# Patient Record
Sex: Male | Born: 1940 | Race: White | Hispanic: No | Marital: Married | Smoking: Never smoker
Health system: Southern US, Community
[De-identification: ages and names within clinical notes are randomized; demographics above are authoritative.]

## PROBLEM LIST (undated history)

## (undated) DIAGNOSIS — E7800 Pure hypercholesterolemia, unspecified: Secondary | ICD-10-CM

## (undated) HISTORY — PX: HAND SURGERY: SHX662

## (undated) HISTORY — PX: SHOULDER SURGERY: SHX246

---

## 2004-08-07 ENCOUNTER — Emergency Department (HOSPITAL_COMMUNITY): Admission: EM | Admit: 2004-08-07 | Discharge: 2004-08-07 | Payer: Self-pay | Admitting: Emergency Medicine

## 2012-11-16 ENCOUNTER — Encounter (HOSPITAL_BASED_OUTPATIENT_CLINIC_OR_DEPARTMENT_OTHER): Payer: Self-pay | Admitting: Emergency Medicine

## 2012-11-16 ENCOUNTER — Emergency Department (HOSPITAL_BASED_OUTPATIENT_CLINIC_OR_DEPARTMENT_OTHER)
Admission: EM | Admit: 2012-11-16 | Discharge: 2012-11-16 | Disposition: A | Discharge status: Home / Self Care | Payer: Worker's Compensation | Attending: Emergency Medicine | Admitting: Emergency Medicine

## 2012-11-16 ENCOUNTER — Emergency Department (HOSPITAL_BASED_OUTPATIENT_CLINIC_OR_DEPARTMENT_OTHER): Payer: Worker's Compensation

## 2012-11-16 DIAGNOSIS — Z79899 Other long term (current) drug therapy: Secondary | ICD-10-CM | POA: Insufficient documentation

## 2012-11-16 DIAGNOSIS — Y9289 Other specified places as the place of occurrence of the external cause: Secondary | ICD-10-CM | POA: Insufficient documentation

## 2012-11-16 DIAGNOSIS — X500XXA Overexertion from strenuous movement or load, initial encounter: Secondary | ICD-10-CM | POA: Insufficient documentation

## 2012-11-16 DIAGNOSIS — IMO0002 Reserved for concepts with insufficient information to code with codable children: Secondary | ICD-10-CM | POA: Insufficient documentation

## 2012-11-16 DIAGNOSIS — Y9389 Activity, other specified: Secondary | ICD-10-CM | POA: Insufficient documentation

## 2012-11-16 DIAGNOSIS — S8390XA Sprain of unspecified site of unspecified knee, initial encounter: Secondary | ICD-10-CM

## 2012-11-16 DIAGNOSIS — E7800 Pure hypercholesterolemia, unspecified: Secondary | ICD-10-CM | POA: Insufficient documentation

## 2012-11-16 HISTORY — DX: Pure hypercholesterolemia, unspecified: E78.00

## 2012-11-16 NOTE — ED Provider Notes (Signed)
History     CSN: [id number]  Arrival date & time 11/16/12  1036   First MD Initiated Contact with Patient 11/16/12 1137      Chief Complaint  Patient presents with  . Knee Injury    (Consider location/radiation/quality/duration/timing/severity/associated sxs/prior treatment) HPI Patient complaining of sudden onset of left knee pain began while he was ambulating at work just prior to admission. As he stepped on  Broken up asphalt, but  Past Medical History  Diagnosis Date  . Hypercholesteremia     No past surgical history on file.  No family history on file.  History  Substance Use Topics  . Smoking status: Not on file  . Smokeless tobacco: Not on file  . Alcohol Use:       Review of Systems  Allergies  Review of patient's allergies indicates no known allergies.  Home Medications   Current Outpatient Rx  Name  Route  Sig  Dispense  Refill  . SIMVASTATIN 40 MG PO TABS   Oral   Take 40 mg by mouth every evening.           BP 171/83  Pulse 79  Temp 98.1 F (36.7 C) (Oral)  Resp 18  SpO2 98%  Physical Exam  Nursing note and vitals reviewed. Constitutional: He is oriented to person, place, and time. He appears well-developed and well-nourished.  HENT:  Head: Normocephalic and atraumatic.  Eyes: Conjunctivae normal are normal. Pupils are equal, round, and reactive to light.  Neck: Normal range of motion. Neck supple.  Cardiovascular: Normal rate.   Pulmonary/Chest: Effort normal.  Abdominal: Soft. Bowel sounds are normal.  Musculoskeletal: Normal range of motion. He exhibits tenderness. He exhibits no edema.       Tender medial lower knee, full arom, no ligament laxity noted.  No effusion noted on exam, dp intact, neurovascularly intact distal to injury.   Neurological: He is alert and oriented to person, place, and time. He has normal reflexes.  Skin: Skin is warm and dry.    ED Course  Procedures (including critical care time)  Labs Reviewed -  No data to display Dg Knee Complete 4 Views Left  11/16/2012  *RADIOLOGY REPORT*  Clinical Data: Twisted knee  LEFT KNEE - COMPLETE 4+ VIEW  Comparison: None.  Findings: Four views of the left knee submitted.  No acute fracture or subluxation.  Spurring of patella is noted.  Small joint effusion.  IMPRESSION: No acute fracture or subluxation.  Spurring of patella.  Small joint effusion.   Original Report Authenticated By: Natasha Mead, M.D.      No diagnosis found.    MDM  Test results x-Shagun Wordell with patient. Plan immobilizer and crutches, patient thinks he'll do better with and I agree. However, we do not have a walker to give him from the emergency department. He'll be discharged on crutches and given a prescription for walker. He is referred to Dr. Pearletha Forge for followup.        Hilario Quarry, MD 11/16/12 [phone]

## 2012-11-16 NOTE — ED Notes (Signed)
Pt was sent home with crutches (temporary) and script for walker.  Pt given phone number for [facility] and several other pharmacies to get walker from.  Workers comp form stapled to d/c paperwork and explained to pt that dr Lois Huxley should fill this out after seeing him, advised him to call and mk apt with this MD tomorrow.

## 2012-11-16 NOTE — ED Notes (Signed)
Pt injured left knee at work.  Pt twisted knee and has pain to anterior lateral side of knee.  Able to move toes, good pulses.  No deformities.  No other injuries.

## 2012-11-18 ENCOUNTER — Encounter: Payer: Self-pay | Admitting: Family Medicine

## 2012-11-18 ENCOUNTER — Ambulatory Visit (INDEPENDENT_AMBULATORY_CARE_PROVIDER_SITE_OTHER): Payer: Worker's Compensation | Admitting: Family Medicine

## 2012-11-18 VITALS — BP 166/82 | HR 78 | Ht 71.0 in | Wt 210.0 lb

## 2012-11-18 DIAGNOSIS — S8990XA Unspecified injury of unspecified lower leg, initial encounter: Secondary | ICD-10-CM

## 2012-11-18 DIAGNOSIS — S8992XA Unspecified injury of left lower leg, initial encounter: Secondary | ICD-10-CM

## 2012-11-18 MED ORDER — MELOXICAM 15 MG PO TABS: 15.0000 mg | ORAL_TABLET | Freq: Every day | ORAL | Status: AC

## 2012-11-18 NOTE — Patient Instructions (Addendum)
You have a medial meniscal tear of your left knee. A lot of people have these and if we can calm down the inflammation and swelling, they can live with these without any problems. Take meloxicam (mobic) 15 mg daily with food x 7 days then as needed. Ice 15 minutes at a time 3-4 times a day. ACE wrap for compression and support. Straight leg raises 3 sets of 10 daily as discussed. Call me if you need something stronger for pain. Out of work until I see you back. Follow up with me in 2 1/2 weeks.

## 2012-11-20 ENCOUNTER — Encounter: Payer: Self-pay | Admitting: Family Medicine

## 2012-11-20 DIAGNOSIS — S8992XA Unspecified injury of left lower leg, initial encounter: Secondary | ICD-10-CM | POA: Insufficient documentation

## 2012-11-20 NOTE — Assessment & Plan Note (Signed)
radiographs normal and with excellent preservation of joint spaces.  Consistent with a medial meniscal tear. Start with conservative treatment first - meloxicam, icing, ace wrap, home exercise program.  Out of work for now.  F/u in about 3 weeks for reevaluation.  Consider MRI, injection if not improving.

## 2012-11-20 NOTE — Progress Notes (Signed)
  Subjective:    Patient ID: Martin Contreras, male    DOB: 03-10-1941, 71 y.o.   MRN: [id number]  PCP: Dr. Alverda Skeans  HPI 71 yo M here for left knee injury.  Patient reports on 12/8 in the morning he was walking to his truck while at work (works for [employer]) when he believes he stepped into a hole and either twisted or hyperextended his left knee. Injury happened quickly with severe pain throughout knee, inability to bear weight. Has had popping since then. Not taking any medications or icing currently. X-rays in ED negative for acute injury but showed moderate DJD. Of note 1 1/2 years ago he had some pain in this knee that improved with cortisone shot. No prior injuries.  Past Medical History  Diagnosis Date  . Hypercholesteremia     Current Outpatient Prescriptions on File Prior to Visit  Medication Sig Dispense Refill  . simvastatin (ZOCOR) 40 MG tablet Take 40 mg by mouth every evening.        Past Surgical History  Procedure Date  . Hand surgery   . Shoulder surgery     rotator cuff    No Known Allergies  History   Social History  . Marital Status: Married    Spouse Name: N/A    Number of Children: N/A  . Years of Education: N/A   Occupational History  . Not on file.   Social History Main Topics  . Smoking status: Never Smoker   . Smokeless tobacco: Not on file  . Alcohol Use: Not on file  . Drug Use: Not on file  . Sexually Active: Not on file   Other Topics Concern  . Not on file   Social History Narrative  . No narrative on file    Family History  Problem Relation Age of Onset  . Sudden death Neg Hx   . Hyperlipidemia Neg Hx   . Hypertension Neg Hx   . Heart attack Neg Hx   . Diabetes Neg Hx     BP 166/82  Pulse 78  Ht 5\' 11"  (1.803 m)  Wt 210 lb (95.255 kg)  BMI 29.29 kg/m2  Review of Systems See HPI above.    Objective:   Physical Exam Gen: NAD  L knee: No gross deformity, ecchymoses, effusion.   TTP medial joint line.   No post patellar facet, lateral joint line, other TTP. ROM 0 - 120 degrees. Negative ant/post drawers. Negative valgus/varus testing. Negative lachmanns. Positive mcmurrays medially.  Positive apleys Negative patellar apprehension, clarkes. NV intact distally    Assessment & Plan:  1. Left knee injury - radiographs normal and with excellent preservation of joint spaces.  Consistent with a medial meniscal tear. Start with conservative treatment first - meloxicam, icing, ace wrap, home exercise program.  Out of work for now.  F/u in about 3 weeks for reevaluation.  Consider MRI, injection if not improving.

## 2012-12-08 ENCOUNTER — Encounter: Payer: Self-pay | Admitting: Family Medicine

## 2012-12-08 ENCOUNTER — Ambulatory Visit (INDEPENDENT_AMBULATORY_CARE_PROVIDER_SITE_OTHER): Payer: Medicare Other | Admitting: Family Medicine

## 2012-12-08 VITALS — BP 152/84 | HR 77 | Ht 71.0 in | Wt 210.0 lb

## 2012-12-08 DIAGNOSIS — S8992XA Unspecified injury of left lower leg, initial encounter: Secondary | ICD-10-CM

## 2012-12-08 DIAGNOSIS — S99929A Unspecified injury of unspecified foot, initial encounter: Secondary | ICD-10-CM

## 2012-12-08 NOTE — Patient Instructions (Addendum)
Follow up with me in 3-4 weeks for reevaluation. Return to work at this time. If you want to go ahead with an injection sooner we can but I don't think this is necessary given how much improvement you've had to date.

## 2012-12-10 ENCOUNTER — Encounter: Payer: Self-pay | Admitting: Family Medicine

## 2012-12-10 NOTE — Assessment & Plan Note (Signed)
radiographs normal and with excellent preservation of joint spaces.  Consistent with a medial meniscal tear. Good improvement since last visit 3 weeks ago.  He inquired about an injection but given his improvement I advised we wait on this.  Return to work tomorrow but no deep squats, crawling.  F/u in 3-4 weeks.

## 2012-12-10 NOTE — Progress Notes (Signed)
  Subjective:    Patient ID: Martin Contreras, male    DOB: 1941-02-02, 72 y.o.   MRN: [id number]  PCP: Dr. Alverda Skeans  HPI  72 yo M here for f/u left knee injury.  12/10: Patient reports on 12/8 in the morning he was walking to his truck while at work (works for [employer]) when he believes he stepped into a hole and either twisted or hyperextended his left knee. Injury happened quickly with severe pain throughout knee, inability to bear weight. Has had popping since then. Not taking any medications or icing currently. X-rays in ED negative for acute injury but showed moderate DJD. Of note 1 1/2 years ago he had some pain in this knee that improved with cortisone shot. No prior injuries.  12/30: Patient reports he feels much better than last visit. Able to bear weight more comfortably. Still gets popping but this is not painful. Takes meloxicam. Not icing or needing to wrap knee now. Some soreness inside part of knee still. Has been out of work.  Past Medical History  Diagnosis Date  . Hypercholesteremia     Current Outpatient Prescriptions on File Prior to Visit  Medication Sig Dispense Refill  . meloxicam (MOBIC) 15 MG tablet Take 1 tablet (15 mg total) by mouth daily. With food.  30 tablet  1  . simvastatin (ZOCOR) 40 MG tablet Take 40 mg by mouth every evening.        Past Surgical History  Procedure Date  . Hand surgery   . Shoulder surgery     rotator cuff    No Known Allergies  History   Social History  . Marital Status: Married    Spouse Name: N/A    Number of Children: N/A  . Years of Education: N/A   Occupational History  . Not on file.   Social History Main Topics  . Smoking status: Never Smoker   . Smokeless tobacco: Not on file  . Alcohol Use: Not on file  . Drug Use: Not on file  . Sexually Active: Not on file   Other Topics Concern  . Not on file   Social History Narrative  . No narrative on file    Family History  Problem Relation  Age of Onset  . Sudden death Neg Hx   . Hyperlipidemia Neg Hx   . Hypertension Neg Hx   . Heart attack Neg Hx   . Diabetes Neg Hx     BP 152/84  Pulse 77  Ht 5\' 11"  (1.803 m)  Wt 210 lb (95.255 kg)  BMI 29.29 kg/m2  Review of Systems  See HPI above.    Objective:   Physical Exam  Gen: NAD  L knee: No gross deformity, ecchymoses, effusion.   Mild TTP medial joint line.  No post patellar facet, lateral joint line, other TTP. FROM Negative ant/post drawers. Negative valgus/varus testing. Negative lachmanns. Negative mcmurrays medially.  Mild pain with apleys Negative patellar apprehension, clarkes. NV intact distally    Assessment & Plan:  1. Left knee injury - radiographs normal and with excellent preservation of joint spaces.  Consistent with a medial meniscal tear. Good improvement since last visit 3 weeks ago.  He inquired about an injection but given his improvement I advised we wait on this.  Return to work tomorrow but no deep squats, crawling.  F/u in 3-4 weeks.

## 2013-01-05 ENCOUNTER — Ambulatory Visit: Payer: Self-pay | Admitting: Family Medicine

## 2013-01-06 ENCOUNTER — Ambulatory Visit: Payer: Self-pay | Admitting: Family Medicine

## 2013-09-06 IMAGING — CR DG KNEE COMPLETE 4+V*L*
4 series · 4 of 4 positions shown · non-contrast
Comparison: None.

CLINICAL DATA: Twisted knee

LEFT KNEE - COMPLETE 4+ VIEW

[t knee ap left]
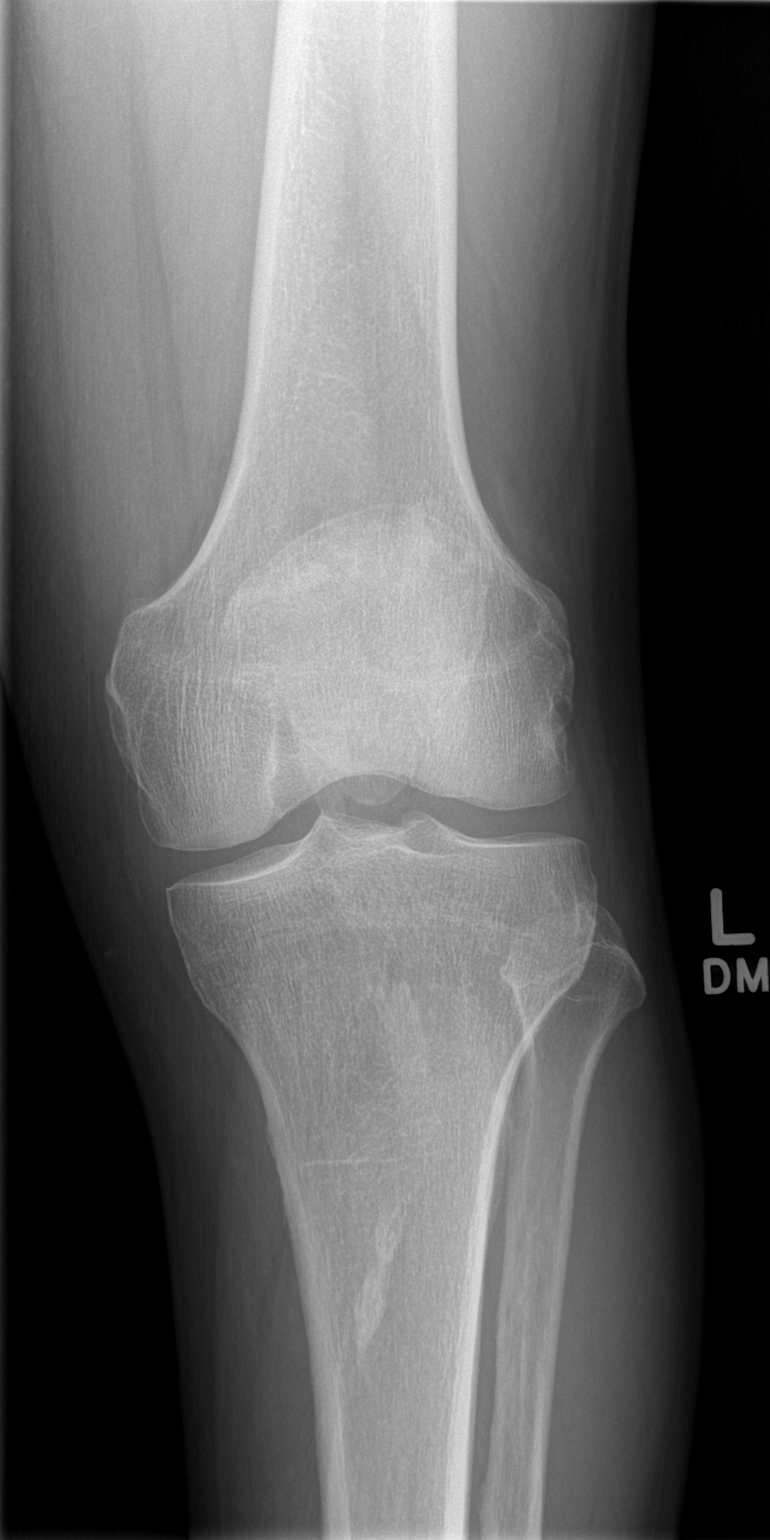

[t knee oblique left (1 of 2)]
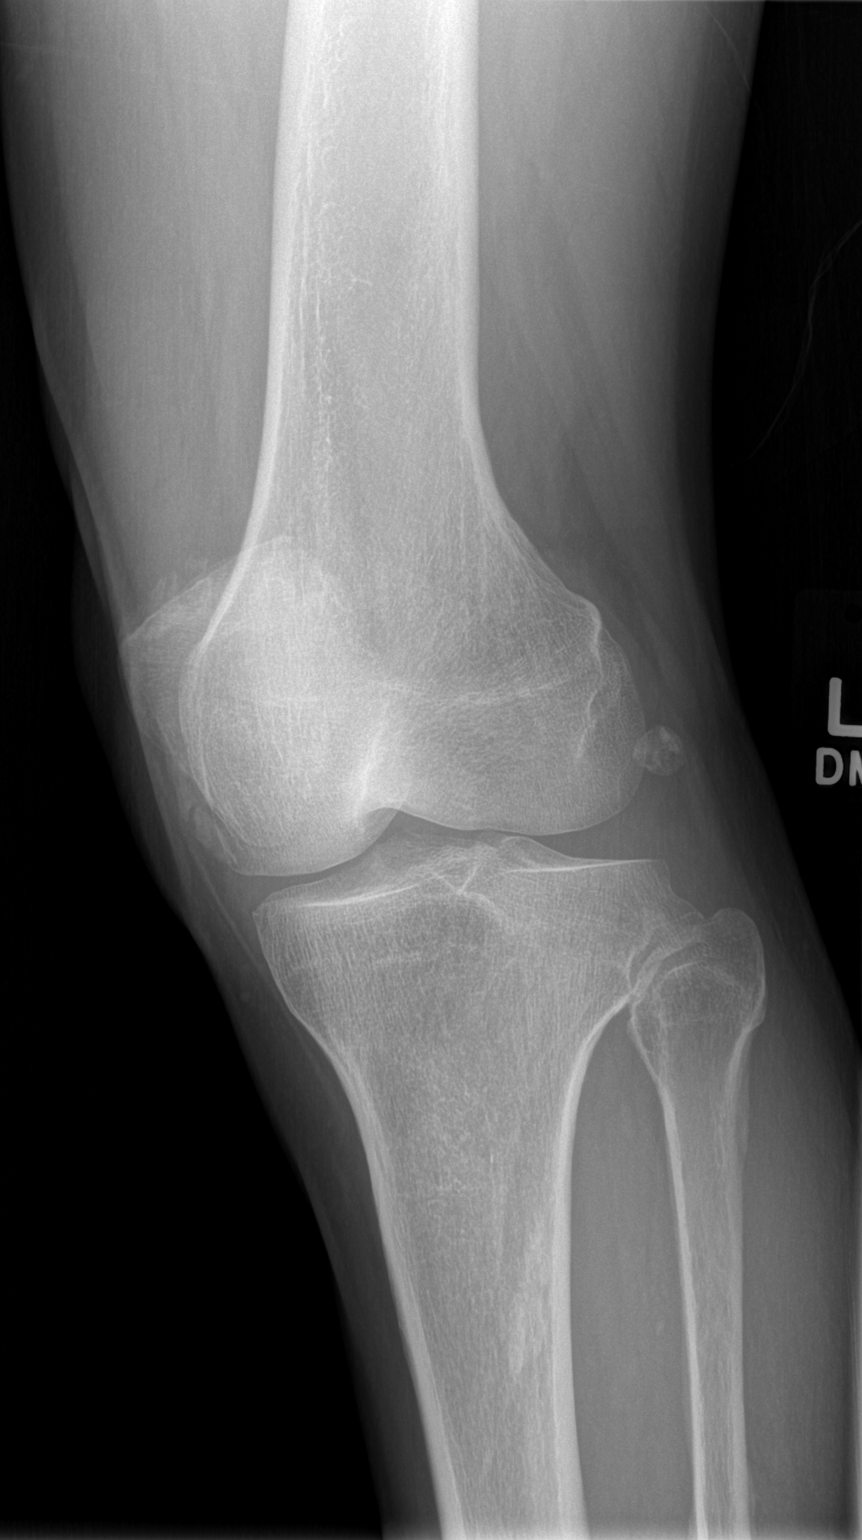

[t knee oblique left (2 of 2)]
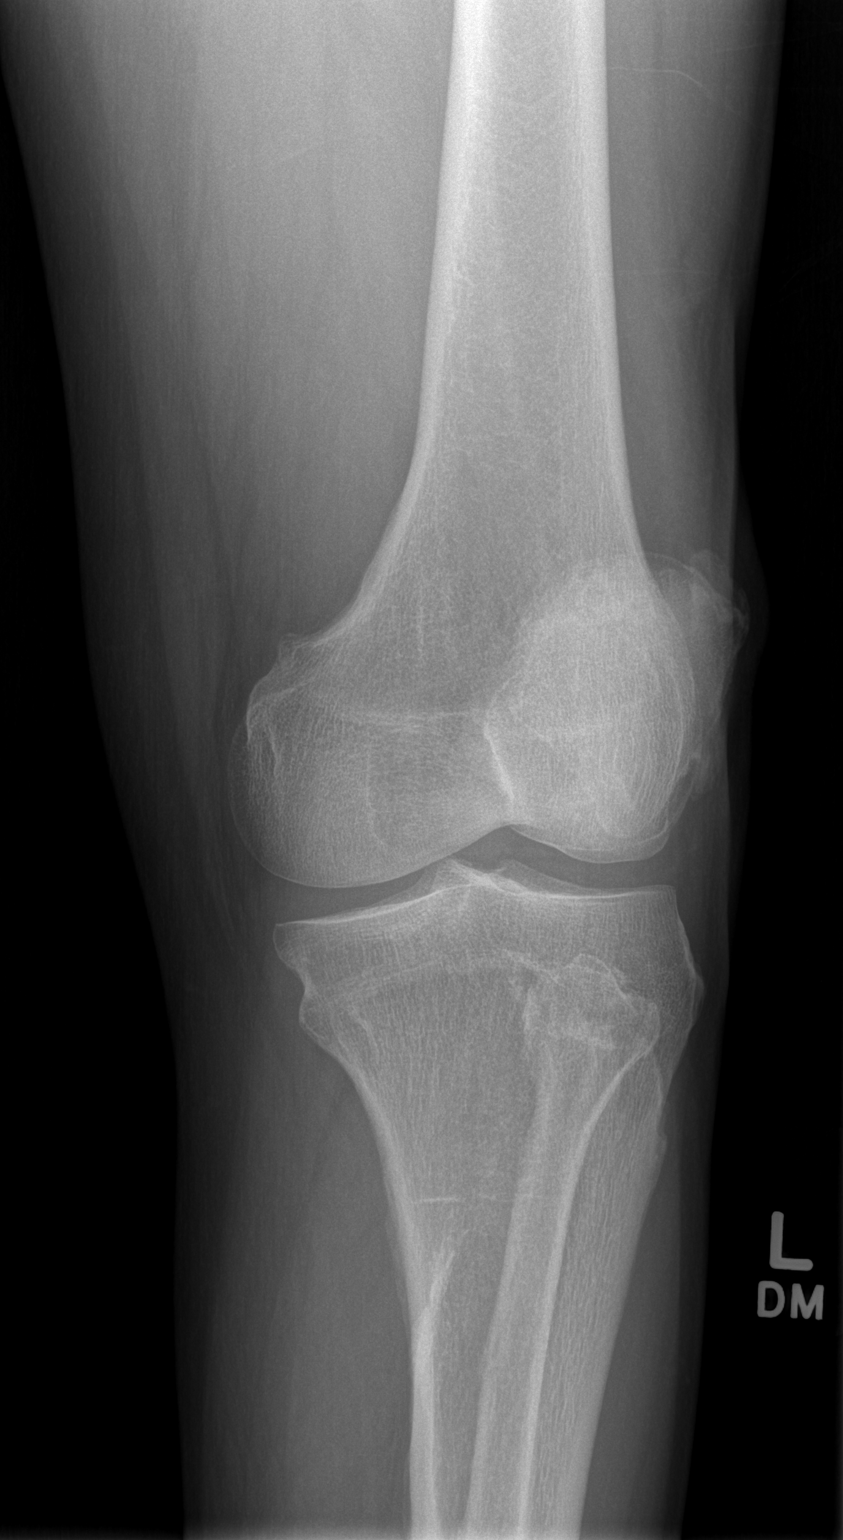

[t knee lat left]
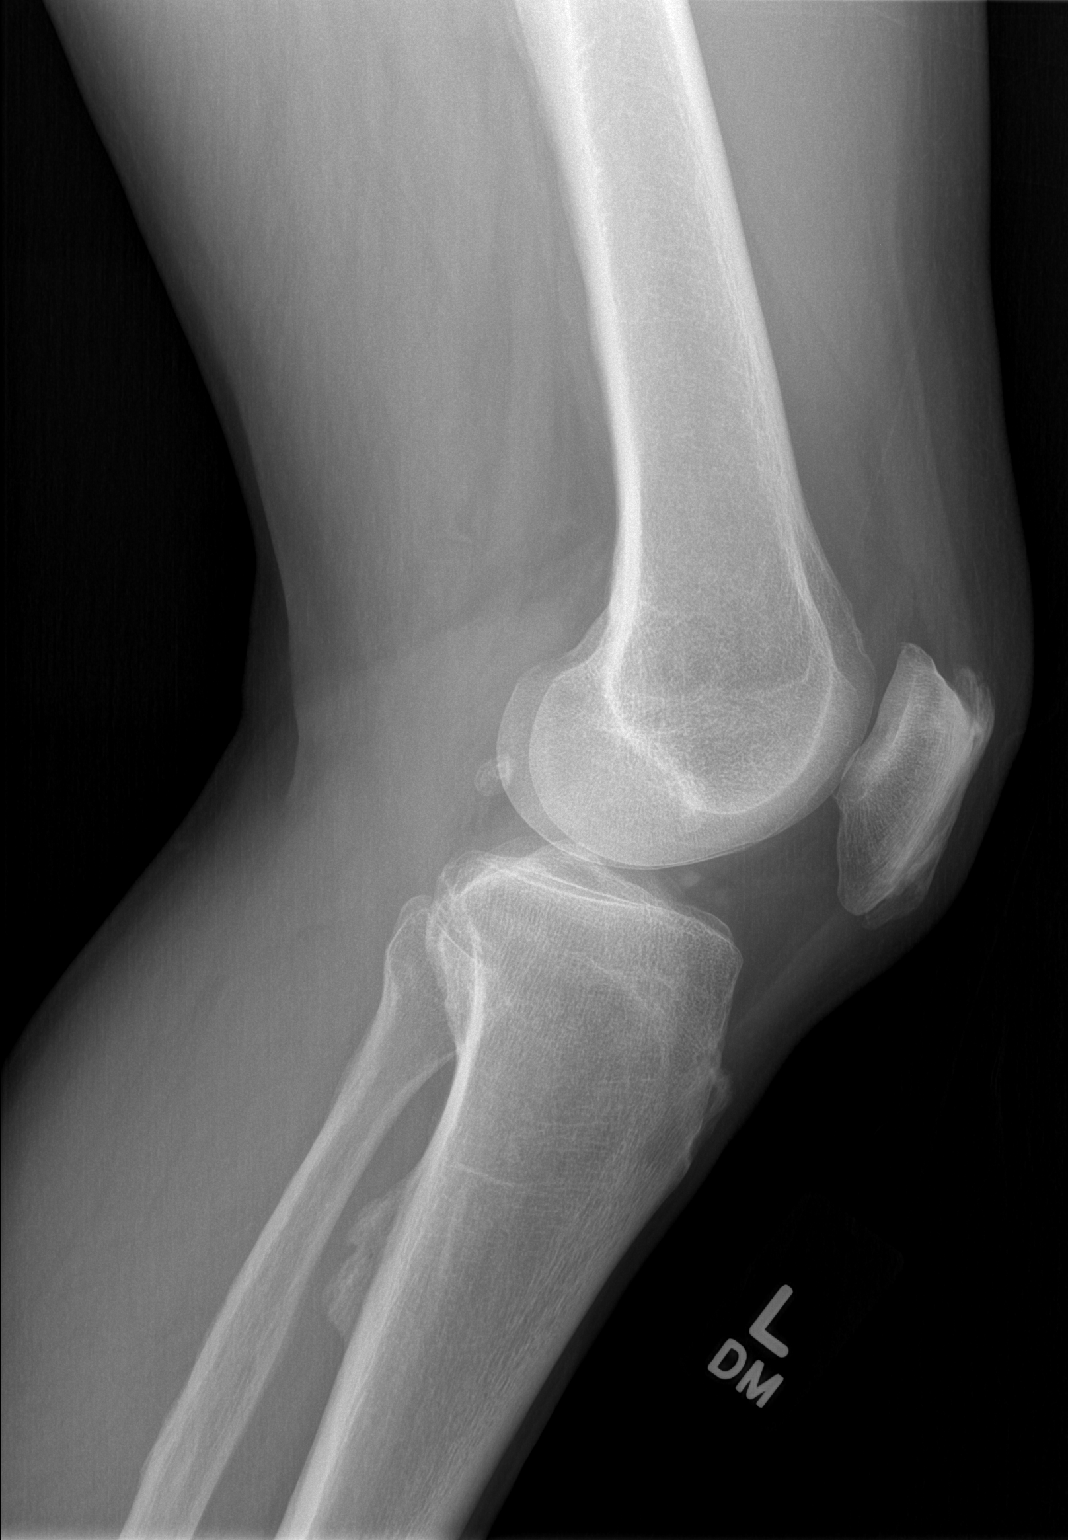

[4 of 4 positions shown; findings below may reference images not displayed]

FINDINGS: Four views of the left knee submitted.  No acute fracture
or subluxation.  Spurring of patella is noted.  Small joint
effusion.
IMPRESSION: No acute fracture or subluxation.  Spurring of patella.  Small
joint effusion.

## 2018-02-13 ENCOUNTER — Ambulatory Visit (INDEPENDENT_AMBULATORY_CARE_PROVIDER_SITE_OTHER): Payer: Medicare HMO | Admitting: Orthopaedic Surgery

## 2018-02-13 ENCOUNTER — Ambulatory Visit (INDEPENDENT_AMBULATORY_CARE_PROVIDER_SITE_OTHER): Payer: Self-pay

## 2018-02-13 ENCOUNTER — Encounter (INDEPENDENT_AMBULATORY_CARE_PROVIDER_SITE_OTHER): Payer: Self-pay | Admitting: Orthopaedic Surgery

## 2018-02-13 VITALS — BP 148/62 | HR 78 | Resp 16 | Ht 71.0 in | Wt 208.0 lb

## 2018-02-13 DIAGNOSIS — G8929 Other chronic pain: Secondary | ICD-10-CM | POA: Diagnosis not present

## 2018-02-13 DIAGNOSIS — M542 Cervicalgia: Secondary | ICD-10-CM | POA: Diagnosis not present

## 2018-02-13 DIAGNOSIS — M25511 Pain in right shoulder: Secondary | ICD-10-CM | POA: Diagnosis not present

## 2018-02-13 NOTE — Progress Notes (Signed)
Office Visit Note   Patient: Martin Contreras           Date of Birth: March 13, 1941           MRN: [id number] Visit Date: 02/13/2018              Requested by: No referring provider defined for this encounter. PCP: Ananias PilgrimAsres, Alehegn, MD   Assessment & Plan: Visit Diagnoses:  1. Cervicalgia   2. Chronic right shoulder pain     Plan: Neck and right upper extremity pain seems to originate from the cervical spine. There is considerable degenerative disc disease on plain film. Normal neurologic evaluation. To try a course of physical therapy and try over-the-counter medicines. If no improvement in the next month would like him to return to consider an MRI scan. Total time spent proximally 45 minutes in counseling and discussing films and treatment  Follow-Up Instructions: Return in about 1 month (around 03/16/2018), or if no improvement.   Orders:  Orders Placed This Encounter  Procedures  . XR Cervical Spine 2 or 3 views  . XR Shoulder Right  . Ambulatory referral to Physical Therapy   No orders of the defined types were placed in this encounter.     Procedures: No procedures performed   Clinical Data: No additional findings.   Subjective: Chief Complaint  Patient presents with  . 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 ALSO HAVING PAIN IN BASE OF NECK.  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 He oftentimes will [place] to [place] over period of about 4 hours. He notes that as time progresses neck will get relatively stiff he'll have some discomfort along the right side of his neck right shoulder and even occasionally some tingling into his right upper extremity. He's not had any injury or trauma. He is over 10 years status post rotator cuff tear repair of his right shoulder. Is not sure that the shoulder is actually the problem. No headaches. No trouble with his left upper extremity. He actually has noticed limited motion  of his neck  HPI  Review of Systems  Constitutional: Negative for fatigue and fever.  HENT: Negative for ear pain and tinnitus.   Eyes: Negative for pain.  Respiratory: Negative for cough and shortness of breath.   Cardiovascular: Negative for chest pain, palpitations and leg swelling.  Gastrointestinal: Negative for blood in stool, constipation and diarrhea.  Genitourinary: Negative for dysuria.  Musculoskeletal: Positive for neck pain and neck stiffness. Negative for back pain.  Skin: Negative for rash and wound.  Allergic/Immunologic: Negative for food allergies.  Neurological: Positive for weakness and numbness. Negative for dizziness, light-headedness and headaches.  Hematological: Does not bruise/bleed easily.  Psychiatric/Behavioral: Negative for sleep disturbance.     Objective: Vital Signs: BP (!) 148/62 (BP Location: Left Arm, Patient Position: Sitting, Cuff Size: Normal)   Pulse 78   Resp 16   Ht 5\' 11"  (1.803 m)   Wt 208 lb (94.3 kg)   BMI 29.01 kg/m   Physical Exam  Ortho Exam awake alert and oriented 3 comfortable sitting. He has considerable loss of motion of the cervical spine. He lacked about 2 fingerbreadths touching her chin to her chest. Only about 30 with normal neck extension from neutral. Only about 45-50 of normal rotation of the right and to the left. There was some pain referred to the posterior cervical chain interscapular region but  no referred pain with any motion to her shoulder either upper extremity. He flexes symmetrical both upper extremity. No pain with range of motion of his right shoulder. No impingement. Negative empty can testing. Good strength. No localized areas of tenderness.  Specialty Comments:  No specialty comments available.  Imaging: Xr Cervical Spine 2 Or 3 Views  Result Date: 02/13/2018 Films of the cervical spine obtained in 2 projections. There is straightening of the normal cervical lordosis. There is considerable  degenerative change C4-5 and C5-6 and C6-7 at those levels there is calcification of the anterior longitudinal ligament. There is facet sclerosis at C2-3 C3-4. No listhesis or evidence of fracture  Xr Shoulder Right  Result Date: 02/13/2018 Films of the right shoulder reveal a previous repair of rotator cuff tear with a metallic anchor. I think the 20 humeral head and the acromium is is normal. There are some degenerative changes acromioclavicular joint but just might be ectopic calcification from an old distal clavicle resection. There is some spurring of the distal aspect of the glenoid. There is a normal space between the humeral head and acromion. The humeral head is centered about the glenoid. No evidence of fracture    PMFS History: Patient Active Problem List   Diagnosis Date Noted  . Left knee injury 11/20/2012   Past Medical History:  Diagnosis Date  . Hypercholesteremia     Family History  Problem Relation Age of Onset  . Sudden death Neg Hx   . Hyperlipidemia Neg Hx   . Hypertension Neg Hx   . Heart attack Neg Hx   . Diabetes Neg Hx     Past Surgical History:  Procedure Laterality Date  . HAND SURGERY    . SHOULDER SURGERY     rotator cuff   Social History   Occupational History  . Not on file  Tobacco Use  . Smoking status: Never Smoker  Substance and Sexual Activity  . Alcohol use: Not on file  . Drug use: Not on file  . Sexual activity: Not on file

## 2018-02-17 ENCOUNTER — Ambulatory Visit (INDEPENDENT_AMBULATORY_CARE_PROVIDER_SITE_OTHER): Payer: Self-pay | Admitting: Orthopaedic Surgery

## 2018-03-05 ENCOUNTER — Ambulatory Visit: Payer: Medicare HMO | Admitting: Rehabilitative and Restorative Service Providers"

## 2018-03-05 ENCOUNTER — Encounter: Payer: Self-pay | Admitting: Rehabilitative and Restorative Service Providers"

## 2018-03-05 DIAGNOSIS — G8929 Other chronic pain: Secondary | ICD-10-CM

## 2018-03-05 DIAGNOSIS — M542 Cervicalgia: Secondary | ICD-10-CM | POA: Diagnosis not present

## 2018-03-05 DIAGNOSIS — R293 Abnormal posture: Secondary | ICD-10-CM

## 2018-03-05 DIAGNOSIS — R29898 Other symptoms and signs involving the musculoskeletal system: Secondary | ICD-10-CM | POA: Diagnosis not present

## 2018-03-05 DIAGNOSIS — M25511 Pain in right shoulder: Secondary | ICD-10-CM | POA: Diagnosis not present

## 2018-03-05 NOTE — Patient Instructions (Signed)
Axial Extension (Chin Tuck)    Pull chin in and lengthen back of neck. Hold __5__ seconds while counting out loud. Repeat __10__ times. Do __several__ sessions per day.  Shoulder Blade Squeeze   Swim noodle along spine  Rotate shoulders back, then squeeze shoulder blades down and back. Hold 10 sec Repeat __10__ times. Do __several __ sessions per day.  Upper Back Strength: Lower Trapezius / Rotator Cuff " L's "     Arms in waitress pose, palms up. Press hands back and slide shoulder blades down. Hold for __5__ seconds. Repeat _10___ times. 1-2 times per day.    Scapular Retraction: Elbow Flexion (Standing)  "W's"     With elbows bent to 90, pinch shoulder blades together and rotate arms out, keeping elbows bent. Repeat __10__ times per set. Do __1-2__ sets per session. Do _several ___ sessions per day.  Scapula Adduction With Pectoralis Stretch: Low - Standing   Shoulders at 45 hands even with shoulders, keeping weight through legs, shift weight forward until you feel pull or stretch through the front of your chest. Hold _30__ seconds. Do _3__ times, _2-4__ times per day.   Scapula Adduction With Pectoralis Stretch: Mid-Range - Standing   Shoulders at 90 elbows even with shoulders, keeping weight through legs, shift weight forward until you feel pull or strength through the front of your chest. Hold __30_ seconds. Do _3__ times, __2-4_ times per day.   Scapula Adduction With Pectoralis Stretch: High - Standing   Shoulders at 120 hands up high on the doorway, keeping weight on feet, shift weight forward until you feel pull or stretch through the front of your chest. Hold _30__ seconds. Do _3__ times, _2-3__ times per day.  TENS UNIT: This is helpful for muscle pain and spasm.   Search and Purchase a TENS 7000 2nd edition at www.tenspros.com. It should be less than $30.     TENS unit instructions: Do not shower or bathe with the unit on Turn the unit off  before removing electrodes or batteries If the electrodes lose stickiness add a drop of water to the electrodes after they are disconnected from the unit and place on plastic sheet. If you continued to have difficulty, call the TENS unit company to purchase more electrodes. Do not apply lotion on the skin area prior to use. Make sure the skin is clean and dry as this will help prolong the life of the electrodes. After use, always check skin for unusual red areas, rash or other skin difficulties. If there are any skin problems, does not apply electrodes to the same area. Never remove the electrodes from the unit by pulling the wires. Do not use the TENS unit or electrodes other than as directed. Do not change electrode placement without consultating your therapist or physician. Keep 2 fingers with between each electrode.    [facility] [address] [place], [place] [address]  [phone] (office) [phone] (fax)

## 2018-03-05 NOTE — Therapy (Signed)
[facility] [place] [address] [place], [place], [address] Phone: [phone]   Fax:  [phone]  Physical Therapy Evaluation  Patient Details  Name: Martin Contreras MRN: [id number] Date of Birth: 1941-02-04 Referring Provider: Dr Norlene Campbell    Encounter Date: 03/05/2018  PT End of Session - 03/05/18 1102    Visit Number  1    Number of Visits  12    Date for PT Re-Evaluation  04/16/18    PT Start Time  1100    PT Stop Time  1158    PT Time Calculation (min)  58 min    Activity Tolerance  Patient tolerated treatment well       Past Medical History:  Diagnosis Date  . Hypercholesteremia     Past Surgical History:  Procedure Laterality Date  . HAND SURGERY    . SHOULDER SURGERY     rotator cuff    There were no vitals filed for this visit.   Subjective Assessment - 03/05/18 1111    Subjective  Patient report that he have had some pain in the Rt neck area for several months - stiffness and some discomfort with movements/activities. he has limited movement in the cervical spine. Patient feels that he Rt shoulder is causing the neck pain. Patient had Rt RCR ~ 10-12 yrs ago.     Pertinent History  HTN; Rt RCR ~ 10-12 yrs ago; cervical pain     Currently in Pain?  Yes    Pain Score  3     Pain Location  Neck    Pain Orientation  Right    Pain Descriptors / Indicators  Tightness;Aching    Pain Radiating Towards  into the Rt side of the neck and upper trap area     Pain Onset  More than a month ago    Pain Frequency  Intermittent    Aggravating Factors   lying on Rt side to sleep; pulling something with Rt UE; lifting something heavy with Rt UE    Pain Relieving Factors  rest         [facility] PT Assessment - 03/05/18 0001      Assessment   Medical Diagnosis  Cervicalgia; chronic Rt shoulder pain     Referring Provider  Dr Norlene Campbell     Onset Date/Surgical Date  08/10/17    Hand Dominance  Right    Next  MD Visit  PRN     Prior Therapy  none for neck      Precautions   Precautions  None      Balance Screen   Has the patient fallen in the past 6 months  No    Has the patient had a decrease in activity level because of a fear of falling?   No    Is the patient reluctant to leave their home because of a fear of falling?   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     Leisure  yard work; household activites; walking ~ 1-2 days a week 30 min; golfing; working on cars       Observation/Other Assessments   Focus on Therapeutic Outcomes (FOTO)   40% limitation       Sensation   Additional Comments  WFLs per pt report  Posture/Postural Control   Posture Comments  head forward; shoulders rounded and elevated; incresaed thoracic kyphosis; scapulae abducted and rotated along the thoracic wall; head of he humerus anterior in orientation       AROM   Right Shoulder Extension  60 Degrees    Right Shoulder Flexion  120 Degrees    Right Shoulder ABduction  115 Degrees    Right Shoulder Internal Rotation  32 Degrees    Right Shoulder External Rotation  75 Degrees    Left Shoulder Extension  42 Degrees    Left Shoulder Flexion  120 Degrees    Left Shoulder ABduction  128 Degrees    Left Shoulder Internal Rotation  32 Degrees    Left Shoulder External Rotation  73 Degrees    Cervical Flexion  42    Cervical Extension  8    Cervical - Right Side Bend  17    Cervical - Left Side Bend  18    Cervical - Right Rotation  41    Cervical - Left Rotation  42      Strength   Overall Strength Comments  5/5 bilat UE's       Palpation   Spinal mobility  hypomobility cervical spine     Palpation comment  muscular tightness through the pecs; upper trap; leveator; ant/lat/posterior cervical musculature Rt > Lt               No data recorded  Objective measurements  completed on examination: See above findings.      [facility] Adult PT Treatment/Exercise - 03/05/18 0001      Therapeutic Activites    Therapeutic Activities  -- myofacial ball release work       Neuro Re-ed    Neuro Re-ed Details   postural correction working on chest lift engaging posterior shoulder girdle musculature       Shoulder Exercises: Standing   Other Standing Exercises  axial extension 10 sec x 10; scapu squeeze 10 sec x 10; L's x 10; W's x 10 with swim noodle       Shoulder Exercises: Stretch   Other Shoulder Stretches  3 way doorway stretch 30 sec x 2 each position       Moist Heat Therapy   Number Minutes Moist Heat  15 Minutes    Moist Heat Location  Cervical;Shoulder Rt      Electrical Stimulation   Electrical Stimulation Location  Rt posterior cervical and Rt shoulder girdle area     Electrical Stimulation Action  IFC    Electrical Stimulation Parameters  to tolerance    Electrical Stimulation Goals  Pain;Tone             PT Education - 03/05/18 1144    Education provided  Yes    Education Details  posture; HEP; ball release work; Museum/gallery curator) Educated  Patient;Spouse wife - Nettie Elm     Methods  Explanation;Demonstration;Tactile cues;Verbal cues;Handout    Comprehension  Verbalized understanding;Returned demonstration;Verbal cues required;Tactile cues required          PT Long Term Goals - 03/05/18 1203      PT LONG TERM GOAL #1   Title  Decrease pain in Rt cervical and shoulder girdle area by 50-75% allowing patient to participate in normal functional activities without limitations 04/16/18    Time  6    Period  Weeks    Status  New      PT LONG TERM  GOAL #2   Title  Improve posture and alignment with patient to demonstrate improved upright posture with posterior shoulder girdle musculature engaged 04/16/18    Time  6    Period  Weeks    Status  New      PT LONG TERM GOAL #3   Title  Increase cervical ext/lateral flexion/rotation by  5-8 degrees and bilat shoulder elevation by 5-10 deg 04/16/18    Time  6    Period  Weeks    Status  New      PT LONG TERM GOAL #4   Title  Independent in HEP 04/16/18    Time  6    Period  Weeks    Status  New      PT LONG TERM GOAL #5   Title  Improve FOTO to </= 33% limitation 04/16/18    Time  6    Period  Weeks    Status  New             Plan - 03/05/18 1200    Clinical Impression Statement  Martin Contreras presents with cervical and shoulder girdle dysfunction including poor posture and alignment; limited cervical/thoracic/shoulder ROM; muscular tightness through cervical and shoulder musculature; tightness and discomfort limiting functional activities. Patient will benefit from PT to address problems identified.     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     Clinical Presentation  Stable    Clinical Decision Making  Low    Rehab Potential  Good    PT Frequency  2x / week    PT Duration  6 weeks    PT Treatment/Interventions  Patient/family education;ADLs/Self Care Home Management;Cryotherapy;Electrical Stimulation;Iontophoresis 4mg /ml Dexamethasone;Moist Heat;Ultrasound;Dry needling;Manual techniques;Neuromuscular re-education;Therapeutic activities;Therapeutic exercise    PT Next Visit Plan  review HEP; add pulley for shoulder ROM; stretch pecs - prolonged snow angel stretch; strengthening posterior shoulder girdle; add manual work Rt upper quarter; modalities as indicated     Consulted and Agree with Plan of Care  Patient       Patient will benefit from skilled therapeutic intervention in order to improve the following deficits and impairments:  Postural dysfunction, Improper body mechanics, Increased fascial restricitons, Increased muscle spasms, Decreased mobility, Decreased range of motion, Decreased activity tolerance, Pain  Visit Diagnosis: Cervicalgia - Plan: PT plan of care  cert/re-cert  Chronic right shoulder pain - Plan: PT plan of care cert/re-cert  Other symptoms and signs involving the musculoskeletal system - Plan: PT plan of care cert/re-cert  Abnormal posture - Plan: PT plan of care cert/re-cert     Problem List Patient Active Problem List   Diagnosis Date Noted  . Left knee injury 11/20/2012    Avian Konigsberg Rober MinionP Therma Lasure PT, MPH  03/05/2018, 12:14 PM  [facility] [address] [place], [place], [address] Phone: [phone]   Fax:  [phone]  Name: Martin Contreras MRN: [id number] Date of Birth: June 17, 1941

## 2018-03-07 ENCOUNTER — Encounter: Payer: Self-pay | Admitting: Rehabilitative and Restorative Service Providers"

## 2018-03-07 ENCOUNTER — Ambulatory Visit: Payer: Medicare HMO | Admitting: Rehabilitative and Restorative Service Providers"

## 2018-03-07 DIAGNOSIS — M542 Cervicalgia: Secondary | ICD-10-CM | POA: Diagnosis not present

## 2018-03-07 DIAGNOSIS — M25511 Pain in right shoulder: Secondary | ICD-10-CM | POA: Diagnosis not present

## 2018-03-07 DIAGNOSIS — R29898 Other symptoms and signs involving the musculoskeletal system: Secondary | ICD-10-CM | POA: Diagnosis not present

## 2018-03-07 DIAGNOSIS — G8929 Other chronic pain: Secondary | ICD-10-CM | POA: Diagnosis not present

## 2018-03-07 DIAGNOSIS — R293 Abnormal posture: Secondary | ICD-10-CM | POA: Diagnosis not present

## 2018-03-07 NOTE — Patient Instructions (Addendum)
Resisted External Rotation: in Neutral - Bilateral   PALMS UP Sit or stand, tubing in both hands, elbows at sides, bent to 90, forearms forward. Pinch shoulder blades together and rotate forearms out. Keep elbows at sides. Repeat __10__ times per set. Do _2-3___ sets per session. Do _2-3___ sessions per day.   Low Row: Standing   Face anchor, feet shoulder width apart. Palms up, pull arms back, squeezing shoulder blades down and back. Repeat 10__ times per set. Do 2-3__ sets per session. Do 1-2 sessions per week. Anchor Height: Waist   Strengthening: Resisted Extension   Hold tubing in right hand, arm forward. Pull arm back, elbow straight. Repeat _10___ times per set. Do 2-3____ sets per session. Do 1-2__ sessions per day.  SUPINE Tips A    Being in the supine position means to be lying on the back. Lying on the back is the position of least compression on the bones and discs of the spine, and helps to re-align the natural curves of the back. Work toward 3-5 min hold - can bend elbows to release stretch as needed then straighten arms out again.   Copyright  VHI. All rights reserved.

## 2018-03-07 NOTE — Therapy (Signed)
[facility] [address] [place] [facility], [place], [address] Phone: [phone]   Fax:  [phone]  Physical Therapy Treatment  Patient Details  Name: Martin Contreras MRN: [id number] Date of Birth: [birth year] Referring Provider: Dr Norlene CampbellPeter Whitfield    Encounter Date: 03/07/2018  PT End of Session - 03/07/18 1022    Visit Number  2    Number of Visits  12    Date for PT Re-Evaluation  04/16/18    PT Start Time  1018    PT Stop Time  1114    PT Time Calculation (min)  56 min    Activity Tolerance  Patient tolerated treatment well       Past Medical History:  Diagnosis Date  . Hypercholesteremia     Past Surgical History:  Procedure Laterality Date  . HAND SURGERY    . SHOULDER SURGERY     rotator cuff    There were no vitals filed for this visit.  Subjective Assessment - 03/07/18 1023    Subjective  Martin Contreras reports that it seems like his shoulders are opening p a little bit. He has been working on his exercises and posture at home. Friend may be ordering a TENS unit for him.     Currently in Pain?  No/denies    Pain Location  Neck                No data recorded       [facility] Adult PT Treatment/Exercise - 03/07/18 0001      Shoulder Exercises: Standing   Extension  Strengthening;Right;Left;20 reps;Theraband    Theraband Level (Shoulder Extension)  Level 2 (Red)    Row  Strengthening;Right;Left;20 reps;Theraband    Theraband Level (Shoulder Row)  Level 2 (Red)    Retraction  Strengthening;Right;Left;20 reps;Theraband    Theraband Level (Shoulder Retraction)  Level 2 (Red)    Other Standing Exercises  axial extension 10 sec x 10; scapu squeeze 10 sec x 10; L's x 10; W's x 10 with swim noodle       Shoulder Exercises: Pulleys   Flexion  -- 10 sec hold x 10 reps       Shoulder Exercises: ROM/Strengthening   UBE (Upper Arm Bike)  L1 x 4 min alt fwd/back each minute       Shoulder Exercises: Stretch   Other Shoulder Stretches  3 way doorway stretch 30 sec x 2 each position     Other Shoulder Stretches  prolonged snow angel 3-4 min hold UE's at ~ 75 deg abd       Moist Heat Therapy   Number Minutes Moist Heat  20 Minutes    Moist Heat Location  Cervical;Shoulder Rt      Electrical Stimulation   Electrical Stimulation Location  Rt posterior cervical and Rt shoulder girdle area     Electrical Stimulation Action  IFC    Electrical Stimulation Parameters  to tolerance    Electrical Stimulation Goals  Pain;Tone      Manual Therapy   Manual therapy comments  pt supine     Soft tissue mobilization  deep tissue work through the ant/lat/post cervical spine musculature bilat; uppertrap/leveator/pecs Rt     Myofascial Release  anterior chest     Passive ROM  assisted lateral cervical flexion without overpressure - to limits of tissue resistance              PT Education - 03/07/18 1036    Education provided  Yes    Education Details  HEP     Person(s) Educated  Patient    Methods  Explanation;Demonstration;Tactile cues;Verbal cues;Handout    Comprehension  Verbalized understanding;Returned demonstration;Verbal cues required;Tactile cues required          PT Long Term Goals - 03/05/18 1203      PT LONG TERM GOAL #1   Title  Decrease pain in Rt cervical and shoulder girdle area by 50-75% allowing patient to participate in normal functional activities without limitations 04/16/18    Time  6    Period  Weeks    Status  New      PT LONG TERM GOAL #2   Title  Improve posture and alignment with patient to demonstrate improved upright posture with posterior shoulder girdle musculature engaged 04/16/18    Time  6    Period  Weeks    Status  New      PT LONG TERM GOAL #3   Title  Increase cervical ext/lateral flexion/rotation by 5-8 degrees and bilat shoulder elevation by 5-10 deg 04/16/18    Time  6    Period  Weeks    Status  New      PT LONG TERM GOAL #4   Title  Independent in  HEP 04/16/18    Time  6    Period  Weeks    Status  New      PT LONG TERM GOAL #5   Title  Improve FOTO to </= 33% limitation 04/16/18    Time  6    Period  Weeks    Status  New            Plan - 03/07/18 1024    Clinical Impression Statement  Good response to initial treatment and HEP. Patient reports no pain in the neck area today. He added exercises today without difficulty. Excellent response to initial treatment.     Rehab Potential  Good    PT Frequency  2x / week    PT Duration  6 weeks    PT Treatment/Interventions  Patient/family education;ADLs/Self Care Home Management;Cryotherapy;Electrical Stimulation;Iontophoresis 4mg /ml Dexamethasone;Moist Heat;Ultrasound;Dry needling;Manual techniques;Neuromuscular re-education;Therapeutic activities;Therapeutic exercise    PT Next Visit Plan  review HEP; add pulley for shoulder ROM; stretch pecs - prolonged snow angel stretch; strengthening posterior shoulder girdle; add manual work Rt upper quarter; modalities as indicated     Consulted and Agree with Plan of Care  Patient       Patient will benefit from skilled therapeutic intervention in order to improve the following deficits and impairments:  Postural dysfunction, Improper body mechanics, Increased fascial restricitons, Increased muscle spasms, Decreased mobility, Decreased range of motion, Decreased activity tolerance, Pain  Visit Diagnosis: Cervicalgia  Chronic right shoulder pain  Other symptoms and signs involving the musculoskeletal system  Abnormal posture     Problem List Patient Active Problem List   Diagnosis Date Noted  . Left knee injury 11/20/2012    Martin Contreras PT, MPH  03/07/2018, 11:03 AM  [facility] [address] [place], [place], [address] Phone: [phone]   Fax:  [phone]  Name: Martin Contreras MRN: [id number] Date of Birth: 07/14/1941

## 2018-03-11 ENCOUNTER — Ambulatory Visit: Payer: Medicare HMO | Admitting: Physical Therapy

## 2018-03-11 DIAGNOSIS — M542 Cervicalgia: Secondary | ICD-10-CM | POA: Diagnosis not present

## 2018-03-11 DIAGNOSIS — R293 Abnormal posture: Secondary | ICD-10-CM | POA: Diagnosis not present

## 2018-03-11 DIAGNOSIS — G8929 Other chronic pain: Secondary | ICD-10-CM

## 2018-03-11 DIAGNOSIS — R29898 Other symptoms and signs involving the musculoskeletal system: Secondary | ICD-10-CM | POA: Diagnosis not present

## 2018-03-11 DIAGNOSIS — M25511 Pain in right shoulder: Secondary | ICD-10-CM | POA: Diagnosis not present

## 2018-03-11 NOTE — Therapy (Signed)
[facility] [place] [address] [place], [place], [address] Phone: [phone]   Fax:  [phone]  Physical Therapy Treatment  Patient Details  Name: Martin Contreras MRN: [id number] Date of Birth: 1941-06-05 Referring Provider: Dr. Norlene Campbell   Encounter Date: 03/11/2018  PT End of Session - 03/11/18 1110    Visit Number  3    Number of Visits  12    Date for PT Re-Evaluation  04/16/18    PT Start Time  1105    PT Stop Time  1157 MHP last 12 min     PT Time Calculation (min)  52 min    Activity Tolerance  Patient tolerated treatment well;No increased pain    Behavior During Therapy  WFL for tasks assessed/performed       Past Medical History:  Diagnosis Date  . Hypercholesteremia     Past Surgical History:  Procedure Laterality Date  . HAND SURGERY    . SHOULDER SURGERY     rotator cuff    There were no vitals filed for this visit.  Subjective Assessment - 03/11/18 1110    Subjective  Martin Contreras reports his Rt shoulder seems to bothered with the shoulder exercises.  He's worried it will make his shoulder worse.  He completed band exercises, "but not as much as I probably should have".  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     Currently in Pain?  Yes    Pain Score  2  and stiffness in neck    Pain Location  Shoulder    Pain Orientation  Right    Pain Descriptors / Indicators  Sore    Aggravating Factors   Lying on Rt side to sleep    Pain Relieving Factors  rest.          [facility] PT Assessment - 03/11/18 0001      Assessment   Medical Diagnosis  Cervicalgia; chronic Rt shoulder pain     Referring Provider  Dr. Norlene Campbell    Onset Date/Surgical Date  08/10/17    Hand Dominance  Right    Next MD Visit  PRN       AROM   Cervical - Right Rotation  27 supine    Cervical - Left Rotation  27 supine       [facility] Adult PT Treatment/Exercise - 03/11/18 0001      Shoulder Exercises: Standing   Extension  Strengthening;Both;10 reps;Theraband 3 sec pause in ext    Theraband Level (Shoulder Extension)  Level 2 (Red)    Row  Strengthening;Right;Left;20 reps;Theraband    Theraband Level (Shoulder Row)  Level 2 (Red)    Retraction  Strengthening;Right;Left;20 reps;Theraband    Theraband Level (Shoulder Retraction)  Level 2 (Red)    Other Standing Exercises  scap squeeze with L's x 10; W's x 10 with swim noodle       Shoulder Exercises: ROM/Strengthening   UBE (Upper Arm Bike)  L1: 1.5 min each direction, standing      Shoulder Exercises: Stretch   Other Shoulder Stretches  3 way doorway stretch 30 sec x 2 each position     Other Shoulder Stretches  prolonged snow angel 3-4 min hold UE's at ~ 75 deg abd, elbow press x 15 sec x 4 reps        Modalities   Modalities  --      Moist Heat Therapy   Number Minutes Moist Heat  12 Minutes  Moist Heat Location  Cervical;Shoulder Rt      Electrical Stimulation   Electrical Stimulation Location  -- pt requests to hold      Manual Therapy   Manual therapy comments  pt supine    Soft tissue mobilization  STM to Rt levator, upper trap, pec maj and minor     Myofascial Release  Rt pec release         PT Long Term Goals - 03/11/18 1122      PT LONG TERM GOAL #1   Title  Decrease pain in Rt cervical and shoulder girdle area by 50-75% allowing patient to participate in normal functional activities without limitations 04/16/18    Time  6    Period  Weeks    Status  On-going      PT LONG TERM GOAL #2   Title  Improve posture and alignment with patient to demonstrate improved upright posture with posterior shoulder girdle musculature engaged 04/16/18    Time  6    Period  Weeks    Status  On-going      PT LONG TERM GOAL #3   Title  Increase cervical ext/lateral flexion/rotation by 5-8 degrees and bilat shoulder elevation by 5-10 deg 04/16/18    Time  6    Period  Weeks    Status  On-going      PT LONG TERM GOAL #4   Title   Independent in HEP 04/16/18    Time  6    Period  Weeks    Status  On-going      PT LONG TERM GOAL #5   Title  Improve FOTO to </= 33% limitation 04/16/18    Time  6    Period  Weeks    Status  On-going            Plan - 03/11/18 1330    Clinical Impression Statement  Pt continues to have tightness in cervical ROM.  He tolerated all exercises well, without increase in pain.  Pt reported reduction of pain and tightness with manual therapy and heat.  Pt progressing towards goals.     Rehab Potential  Good    PT Frequency  2x / week    PT Duration  6 weeks    PT Treatment/Interventions  Patient/family education;ADLs/Self Care Home Management;Cryotherapy;Electrical Stimulation;Iontophoresis 4mg /ml Dexamethasone;Moist Heat;Ultrasound;Dry needling;Manual techniques;Neuromuscular re-education;Therapeutic activities;Therapeutic exercise    PT Next Visit Plan  review HEP; add pulley for shoulder ROM; strengthening posterior shoulder girdle.  Add gentle cervical stretches.    Consulted and Agree with Plan of Care  Patient       Patient will benefit from skilled therapeutic intervention in order to improve the following deficits and impairments:  Postural dysfunction, Improper body mechanics, Increased fascial restricitons, Increased muscle spasms, Decreased mobility, Decreased range of motion, Decreased activity tolerance, Pain  Visit Diagnosis: Cervicalgia  Chronic right shoulder pain  Other symptoms and signs involving the musculoskeletal system  Abnormal posture     Problem List Patient Active Problem List   Diagnosis Date Noted  . Left knee injury 11/20/2012   Mayer CamelJennifer Carlson-Long, PTA 03/11/18 1:33 PM  [facility] [address] [place], [place], [address] Phone: [phone]   Fax:  [phone]  Name: Martin Contreras MRN: [id number] Date of Birth: Sep 10, 1941

## 2018-03-14 ENCOUNTER — Encounter: Payer: Self-pay | Admitting: Rehabilitative and Restorative Service Providers"

## 2018-03-14 ENCOUNTER — Ambulatory Visit: Payer: Medicare HMO | Admitting: Rehabilitative and Restorative Service Providers"

## 2018-03-14 DIAGNOSIS — R29898 Other symptoms and signs involving the musculoskeletal system: Secondary | ICD-10-CM | POA: Diagnosis not present

## 2018-03-14 DIAGNOSIS — G8929 Other chronic pain: Secondary | ICD-10-CM

## 2018-03-14 DIAGNOSIS — R293 Abnormal posture: Secondary | ICD-10-CM

## 2018-03-14 DIAGNOSIS — M25511 Pain in right shoulder: Secondary | ICD-10-CM

## 2018-03-14 DIAGNOSIS — M542 Cervicalgia: Secondary | ICD-10-CM | POA: Diagnosis not present

## 2018-03-14 NOTE — Patient Instructions (Signed)

## 2018-03-14 NOTE — Therapy (Signed)
[facility] [address] [place], [place], [address] Phone: [phone]   Fax:  [phone]  Physical Therapy Treatment  Patient Details  Name: Martin Contreras MRN: [id number] Date of Birth: Jan 01, 1941 Referring Provider: Dr. Norlene CampbellPeter Whitfield   Encounter Date: 03/14/2018  PT End of Session - 03/14/18 1104    Visit Number  4    Number of Visits  12    Date for PT Re-Evaluation  04/16/18    PT Start Time  1103    PT Stop Time  1159    PT Time Calculation (min)  56 min    Activity Tolerance  Patient tolerated treatment well       Past Medical History:  Diagnosis Date  . Hypercholesteremia     Past Surgical History:  Procedure Laterality Date  . HAND SURGERY    . SHOULDER SURGERY     rotator cuff    There were no vitals filed for this visit.  Subjective Assessment - 03/14/18 1106    Subjective  Dorene SorrowJerry reports that his Rt shoulder is bothering him some today. 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 He can't tell any difference in the shoulder pain. OTC meds don't seem to help. Eight hours of sleep make a big difference in how the shoulder feels.     Currently in Pain?  Yes    Pain Score  4     Pain Location  Shoulder    Pain Orientation  Right    Pain Descriptors / Indicators  Sore    Pain Radiating Towards  into the Rt side of the neck and upper trap area     Pain Onset  More than a month ago    Pain Frequency  Intermittent                       [facility] Adult PT Treatment/Exercise - 03/14/18 0001      Shoulder Exercises: ROM/Strengthening   UBE (Upper Arm Bike)  L1: 1.5 min each direction, standing      Shoulder Exercises: Stretch   Other Shoulder Stretches  3 way doorway stretch 30 sec x 2 each position     Other Shoulder Stretches  prolonged snow angel 3-4 min hold UE's at ~ 75 deg abd       Moist Heat Therapy   Number Minutes Moist Heat  20 Minutes    Moist Heat Location  Cervical;Shoulder Rt      Electrical Stimulation   Electrical Stimulation Location  Rt posterior cervical and Rt shoulder girdle area     Electrical Stimulation Action  IFC    Electrical Stimulation Parameters  to tolerance    Electrical Stimulation Goals  Pain;Tone      Manual Therapy   Manual therapy comments  pt supine    Soft tissue mobilization  deep tissue work through the Rt lateral and anterior cervical musculature; pecs; upper trap; leveator    Myofascial Release  anterior chest/pecs        Trigger Point Dry Needling - 03/14/18 1138    Consent Given?  Yes    Education Handout Provided  Yes    Muscles Treated Upper Body  -- Rt lateral cervical     Upper Trapezius Response  Palpable increased muscle length proximally     Oblique Capitus Response  Palpable increased muscle length           PT  Education - 03/14/18 1139    Education provided  Yes    Education Details  DN    Person(s) Educated  Patient    Methods  Explanation    Comprehension  Verbalized understanding          PT Long Term Goals - 03/11/18 1122      PT LONG TERM GOAL #1   Title  Decrease pain in Rt cervical and shoulder girdle area by 50-75% allowing patient to participate in normal functional activities without limitations 04/16/18    Time  6    Period  Weeks    Status  On-going      PT LONG TERM GOAL #2   Title  Improve posture and alignment with patient to demonstrate improved upright posture with posterior shoulder girdle musculature engaged 04/16/18    Time  6    Period  Weeks    Status  On-going      PT LONG TERM GOAL #3   Title  Increase cervical ext/lateral flexion/rotation by 5-8 degrees and bilat shoulder elevation by 5-10 deg 04/16/18    Time  6    Period  Weeks    Status  On-going      PT LONG TERM GOAL #4   Title  Independent in HEP 04/16/18    Time  6    Period  Weeks    Status  On-going      PT LONG TERM GOAL #5   Title  Improve FOTO to </= 33%  limitation 04/16/18    Time  6    Period  Weeks    Status  On-going            Plan - 03/14/18 1110    Clinical Impression Statement  Persistent pain in the Rt anterior and superior shoulder as well as tightness in the Rt lateral cervical musuclature. Good response to DN followed by manual work with decresed tightness and pain following treatment.     Rehab Potential  Good    PT Frequency  2x / week    PT Duration  6 weeks    PT Treatment/Interventions  Patient/family education;ADLs/Self Care Home Management;Cryotherapy;Electrical Stimulation;Iontophoresis 4mg /ml Dexamethasone;Moist Heat;Ultrasound;Dry needling;Manual techniques;Neuromuscular re-education;Therapeutic activities;Therapeutic exercise    PT Next Visit Plan  review HEP; add pulley for shoulder ROM; strengthening posterior shoulder girdle. Assess response to DN. Add ionto to shoulder at next visit     Consulted and Agree with Plan of Care  Patient       Patient will benefit from skilled therapeutic intervention in order to improve the following deficits and impairments:  Postural dysfunction, Improper body mechanics, Increased fascial restricitons, Increased muscle spasms, Decreased mobility, Decreased range of motion, Decreased activity tolerance, Pain  Visit Diagnosis: Cervicalgia  Chronic right shoulder pain  Other symptoms and signs involving the musculoskeletal system  Abnormal posture     Problem List Patient Active Problem List   Diagnosis Date Noted  . Left knee injury 11/20/2012    Deforest Maiden Rober Minion PT, MPH  03/14/2018, 11:48 AM  [facility] [address] [place], [place], [address] Phone: [phone]   Fax:  [phone]  Name: Martin Contreras MRN: [id number] Date of Birth: 12/20/1940

## 2018-03-17 ENCOUNTER — Encounter: Payer: Self-pay | Admitting: Rehabilitative and Restorative Service Providers"

## 2018-03-17 ENCOUNTER — Ambulatory Visit: Payer: Medicare HMO | Admitting: Rehabilitative and Restorative Service Providers"

## 2018-03-17 DIAGNOSIS — M542 Cervicalgia: Secondary | ICD-10-CM

## 2018-03-17 DIAGNOSIS — R29898 Other symptoms and signs involving the musculoskeletal system: Secondary | ICD-10-CM

## 2018-03-17 DIAGNOSIS — G8929 Other chronic pain: Secondary | ICD-10-CM | POA: Diagnosis not present

## 2018-03-17 DIAGNOSIS — R293 Abnormal posture: Secondary | ICD-10-CM | POA: Diagnosis not present

## 2018-03-17 DIAGNOSIS — M25511 Pain in right shoulder: Secondary | ICD-10-CM

## 2018-03-17 NOTE — Patient Instructions (Signed)

## 2018-03-17 NOTE — Therapy (Signed)
[facility] [place] [address] [place], [place], [address] Phone: [phone]   Fax:  [phone]  Physical Therapy Treatment  Patient Details  Name: Martin Contreras MRN: [id number] Date of Birth: 07-08-1941 Referring Provider: Dr Norlene CampbellPeter Whitfield    Encounter Date: 03/17/2018  PT End of Session - 03/17/18 1516    Visit Number  5    Number of Visits  12    Date for PT Re-Evaluation  04/16/18    PT Start Time  1515    PT Stop Time  1612    PT Time Calculation (min)  57 min    Activity Tolerance  Patient tolerated treatment well       Past Medical History:  Diagnosis Date  . Hypercholesteremia     Past Surgical History:  Procedure Laterality Date  . HAND SURGERY    . SHOULDER SURGERY     rotator cuff    There were no vitals filed for this visit.  Subjective Assessment - 03/17/18 1518    Subjective  Martin SorrowJerry reports that he feels the DN helped some and he was not so sore. He did drive again Saturday night and things in the arm were worse than before driving. Shoulder and arm are tight feeling especially in the Rt posterior neck/shoulder area.    Patient Stated Goals  Get rid of the Rt neck and shoulder pain     Currently in Pain?  Yes    Pain Score  1     Pain Location  Shoulder    Pain Orientation  Right    Pain Descriptors / Indicators  Tightness;Sore    Pain Type  Acute pain    Pain Onset  More than a month ago    Pain Frequency  Intermittent         [facility] PT Assessment - 03/17/18 0001      Assessment   Medical Diagnosis  Cervicalgia; chronic Rt shoulder pain     Referring Provider  Dr Norlene CampbellPeter Whitfield     Onset Date/Surgical Date  08/10/17    Hand Dominance  Right    Next MD Visit  PRN       Palpation   Palpation comment  muscular tightness through posterior lateral cervical/thoracic paraspinals especially at C6/6 and T3/4 as well as the pecs; upper trap; leveator; ant/lat/posterior cervical musculature Rt >  Lt                    [facility] Adult PT Treatment/Exercise - 03/17/18 0001      Shoulder Exercises: Standing   Other Standing Exercises  scap squeeze with L's x 10; W's x 10 with swim noodle       Shoulder Exercises: ROM/Strengthening   UBE (Upper Arm Bike)  L1: 1.5 min each direction, standing      Shoulder Exercises: Stretch   Other Shoulder Stretches  3 way doorway stretch 30 sec x 2 each position       Moist Heat Therapy   Number Minutes Moist Heat  20 Minutes    Moist Heat Location  Cervical;Shoulder Rt      Electrical Stimulation   Electrical Stimulation Location  Rt posterior cervical/thoracic spine and Rt shoulder girdle area     Electrical Stimulation Action  IFC    Electrical Stimulation Parameters  to tolerance    Electrical Stimulation Goals  Pain;Tone      Iontophoresis   Type of Iontophoresis  Dexamethasone    Location  Rt anterior shoulder     Dose  120 mAmp    Time  12 hr       Manual Therapy   Manual therapy comments  pt sitting     Soft tissue mobilization  deep tissue work through the Rt lateral and posterior cervical musculature; pecs; upper trap; leveator    Myofascial Release  Rt upper trap        Trigger Point Dry Needling - 03/17/18 1538    Consent Given?  Yes    Muscles Treated Upper Body  -- cervical thoracic paraspinals Rt with estim     Upper Trapezius Response  Palpable increased muscle length           PT Education - 03/17/18 1550    Education provided  Yes    Education Details  ionto     Person(s) Educated  Patient    Methods  Explanation    Comprehension  Verbalized understanding          PT Long Term Goals - 03/17/18 1524      PT LONG TERM GOAL #1   Title  Decrease pain in Rt cervical and shoulder girdle area by 50-75% allowing patient to participate in normal functional activities without limitations 04/16/18    Time  6    Period  Weeks    Status  On-going      PT LONG TERM GOAL #2   Title  Improve posture  and alignment with patient to demonstrate improved upright posture with posterior shoulder girdle musculature engaged 04/16/18    Time  6    Period  Weeks    Status  On-going      PT LONG TERM GOAL #3   Title  Increase cervical ext/lateral flexion/rotation by 5-8 degrees and bilat shoulder elevation by 5-10 deg 04/16/18    Time  6    Period  Weeks    Status  On-going      PT LONG TERM GOAL #4   Title  Independent in HEP 04/16/18    Time  6    Period  Weeks    Status  On-going      PT LONG TERM GOAL #5   Title  Improve FOTO to </= 33% limitation 04/16/18    Time  6    Period  Weeks    Status  On-going            Plan - 03/17/18 1522    Clinical Impression Statement  Good response to DN at last visit and again today with patient tolerating the needling well - followed by manual work and stretching. Focus on the cervical/thoracic junctuion area Rt side. Gradually progressing well with the rehab goals.     Rehab Potential  Good    PT Frequency  2x / week    PT Duration  6 weeks    PT Treatment/Interventions  Patient/family education;ADLs/Self Care Home Management;Cryotherapy;Electrical Stimulation;Iontophoresis 4mg /ml Dexamethasone;Moist Heat;Ultrasound;Dry needling;Manual techniques;Neuromuscular re-education;Therapeutic activities;Therapeutic exercise    PT Next Visit Plan  review HEP; add pulley for shoulder ROM; strengthening posterior shoulder girdle. Assess response to DN. assses response to ionto to shoulder    Consulted and Agree with Plan of Care  Patient       Patient will benefit from skilled therapeutic intervention in order to improve the following deficits and impairments:  Postural dysfunction, Improper body mechanics, Increased fascial restricitons, Increased muscle spasms, Decreased mobility, Decreased range of motion, Decreased activity tolerance, Pain  Visit Diagnosis:  Cervicalgia  Chronic right shoulder pain  Other symptoms and signs involving the  musculoskeletal system  Abnormal posture     Problem List Patient Active Problem List   Diagnosis Date Noted  . Left knee injury 11/20/2012    Dawson Hollman Rober Minion PT, MPH  03/17/2018, 3:56 PM  [facility] [address] [place], [place], [address] Phone: [phone]   Fax:  [phone]  Name: Briyan Kleven MRN: [id number] Date of Birth: 13-Oct-1941

## 2018-03-20 ENCOUNTER — Ambulatory Visit (INDEPENDENT_AMBULATORY_CARE_PROVIDER_SITE_OTHER): Payer: Medicare HMO | Admitting: Rehabilitative and Restorative Service Providers"

## 2018-03-20 ENCOUNTER — Encounter: Payer: Self-pay | Admitting: Rehabilitative and Restorative Service Providers"

## 2018-03-20 DIAGNOSIS — G8929 Other chronic pain: Secondary | ICD-10-CM | POA: Diagnosis not present

## 2018-03-20 DIAGNOSIS — M25511 Pain in right shoulder: Secondary | ICD-10-CM

## 2018-03-20 DIAGNOSIS — R29898 Other symptoms and signs involving the musculoskeletal system: Secondary | ICD-10-CM

## 2018-03-20 DIAGNOSIS — M542 Cervicalgia: Secondary | ICD-10-CM

## 2018-03-20 DIAGNOSIS — R293 Abnormal posture: Secondary | ICD-10-CM | POA: Diagnosis not present

## 2018-03-20 NOTE — Therapy (Addendum)
[place] [address] [place], [place], [address] Phone: [phone]   Fax:  [phone]  Physical Therapy Treatment  Patient Details  Name: Martin Contreras MRN: [id number] Date of Birth: 09/30/1941 Referring Provider: Dr Joni Fears   Encounter Date: 03/20/2018  PT End of Session - 03/20/18 1148    Visit Number  6    Number of Visits  12    Date for PT Re-Evaluation  04/16/18    PT Start Time  1147    PT Stop Time  1243    PT Time Calculation (min)  56 min    Activity Tolerance  Patient tolerated treatment well       Past Medical History:  Diagnosis Date  . Hypercholesteremia     Past Surgical History:  Procedure Laterality Date  . HAND SURGERY    . SHOULDER SURGERY     rotator cuff    There were no vitals filed for this visit.  Subjective Assessment - 03/20/18 1149    Subjective  [name] reports that he is feeling much better following last treatment. He worked a lot in the yard yesterday and was sore - but he feels good again today. Not having nearly as much pain now. Feeling good about his progress.     Currently in Pain?  Yes    Pain Score  1     Pain Location  Shoulder    Pain Orientation  Right    Pain Descriptors / Indicators  Tightness;Sore    Pain Type  Acute pain         [facility] PT Assessment - 03/20/18 0001      Assessment   Medical Diagnosis  Cervicalgia; chronic Rt shoulder pain     Referring Provider  Dr Joni Fears    Onset Date/Surgical Date  08/10/17    Hand Dominance  Right    Next MD Visit  PRN       Observation/Other Assessments   Focus on Therapeutic Outcomes (FOTO)   31% limitaiton       Posture/Postural Control   Posture Comments  improved posture with less head forward; shoulders rounded and elevated; increased thoracic kyphosis; scapulae abducted and rotated along the thoracic wall; head of he humerus anterior in orientation       AROM   Right Shoulder Extension   60 Degrees    Right Shoulder Flexion  124 Degrees    Right Shoulder ABduction  116 Degrees    Right Shoulder Internal Rotation  32 Degrees    Right Shoulder External Rotation  72 Degrees    Left Shoulder Extension  42 Degrees    Left Shoulder Flexion  120 Degrees    Left Shoulder ABduction  128 Degrees    Left Shoulder Internal Rotation  33 Degrees    Left Shoulder External Rotation  82 Degrees    Cervical Flexion  40    Cervical Extension  22    Cervical - Right Side Bend  20    Cervical - Left Side Bend  19    Cervical - Right Rotation  35    Cervical - Left Rotation  42      Palpation   Palpation comment  decreased muscular tightness through posterior lateral cervical/thoracic paraspinals especially at C6/6 and T3/4 as well as the pecs; upper trap; leveator; ant/lat/posterior cervical musculature Rt > Lt  [facility] Adult PT Treatment/Exercise - 03/20/18 0001      Shoulder Exercises: Standing   Extension  Strengthening;Both;10 reps;Theraband    Theraband Level (Shoulder Extension)  Level 3 (Green)    Row  Strengthening;Right;Left;20 reps;Theraband    Theraband Level (Shoulder Row)  Level 3 (Green)    Retraction  Strengthening;Right;Left;20 reps;Theraband    Theraband Level (Shoulder Retraction)  Level 3 (Green)    Other Standing Exercises  scap squeeze with L's x 10; W's x 10 with swim noodle       Shoulder Exercises: ROM/Strengthening   UBE (Upper Arm Bike)  L2: 2 min each direction, standing, alternating directions       Shoulder Exercises: Stretch   Other Shoulder Stretches  3 way doorway stretch 30 sec x 2 each position       Moist Heat Therapy   Number Minutes Moist Heat  20 Minutes    Moist Heat Location  Cervical;Shoulder Rt      Electrical Stimulation   Electrical Stimulation Location  Rt posterior cervical/thoracic spine and Rt shoulder girdle area     Electrical Stimulation Action  IFC    Electrical Stimulation Parameters  to  tolerance    Electrical Stimulation Goals  Pain;Tone      Iontophoresis   Type of Iontophoresis  Dexamethasone    Location  Rt anterior shoulder     Dose  120 mAmp    Time  12 hr       Manual Therapy   Manual therapy comments  pt sitting     Soft tissue mobilization  deep tissue work through the Rt lateral and posterior cervical musculature; pecs; upper trap; leveator    Myofascial Release  Rt upper trap        Trigger Point Dry Needling - 03/20/18 1217    Consent Given?  Yes    Muscles Treated Upper Body  -- Rt with estim thoracic paraspinals                 PT Long Term Goals - 03/20/18 1245      PT LONG TERM GOAL #1   Title  Decrease pain in Rt cervical and shoulder girdle area by 50-75% allowing patient to participate in normal functional activities without limitations 04/16/18    Time  6    Period  Weeks    Status  Achieved      PT LONG TERM GOAL #2   Title  Improve posture and alignment with patient to demonstrate improved upright posture with posterior shoulder girdle musculature engaged 04/16/18    Time  6    Period  Weeks    Status  Partially Met      PT LONG TERM GOAL #3   Title  Increase cervical ext/lateral flexion/rotation by 5-8 degrees and bilat shoulder elevation by 5-10 deg 04/16/18    Time  6    Period  Weeks    Status  Achieved      PT LONG TERM GOAL #4   Title  Independent in HEP 04/16/18    Time  6    Period  Weeks    Status  Achieved      PT LONG TERM GOAL #5   Title  Improve FOTO to </= 33% limitation 04/16/18    Time  6    Period  Weeks    Status  Achieved            Plan - 03/20/18 1239    Clinical  Impression Statement  Excellent progress with last treatment. Patient was symptom free until he worked for several hours in his yard yesterday. He noticed some increase in tightness but it was resolved by this morning. 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 He demonstrates increased cervical ROM and decresaed palpable tightness. Patient has accomplished most of PT goals. He will call to schedule additional appointments as needed.     Rehab Potential  Good    PT Frequency  2x / week    PT Treatment/Interventions  Patient/family education;ADLs/Self Care Home Management;Cryotherapy;Electrical Stimulation;Iontophoresis 91m/ml Dexamethasone;Moist Heat;Ultrasound;Dry needling;Manual techniques;Neuromuscular re-education;Therapeutic activities;Therapeutic exercise    PT Next Visit Plan  patient will be placed on hold for 2 weeks - willcall to schedule as needed     Consulted and Agree with Plan of Care  Patient       Patient will benefit from skilled therapeutic intervention in order to improve the following deficits and impairments:  Postural dysfunction, Improper body mechanics, Increased fascial restricitons, Increased muscle spasms, Decreased mobility, Decreased range of motion, Decreased activity tolerance, Pain  Visit Diagnosis: Cervicalgia  Chronic right shoulder pain  Other symptoms and signs involving the musculoskeletal system  Abnormal posture     Problem List Patient Active Problem List   Diagnosis Date Noted  . Left knee injury 11/20/2012    Renetta Suman PNilda SimmerPT, MPH  03/20/2018, 12:46 PM  [facility] [address]Phone: [phone]  Fax:  [phone] Name: JTamel AbelMRN: [id number] of Birth: 4April 19, 1942

## 2018-04-08 ENCOUNTER — Encounter: Payer: Self-pay | Admitting: Rehabilitative and Restorative Service Providers"

## 2018-04-08 ENCOUNTER — Ambulatory Visit: Payer: Medicare HMO | Admitting: Rehabilitative and Restorative Service Providers"

## 2018-04-08 DIAGNOSIS — R29898 Other symptoms and signs involving the musculoskeletal system: Secondary | ICD-10-CM

## 2018-04-08 DIAGNOSIS — R293 Abnormal posture: Secondary | ICD-10-CM | POA: Diagnosis not present

## 2018-04-08 DIAGNOSIS — G8929 Other chronic pain: Secondary | ICD-10-CM

## 2018-04-08 DIAGNOSIS — M25511 Pain in right shoulder: Secondary | ICD-10-CM

## 2018-04-08 DIAGNOSIS — M542 Cervicalgia: Secondary | ICD-10-CM | POA: Diagnosis not present

## 2018-04-08 NOTE — Therapy (Signed)
[facility] [place] [address] [place] [address] [place], [place], [address] Phone: [phone]   Fax:  [phone]  Physical Therapy Treatment  Patient Details  Name: Martin Contreras MRN: [id number] Date of Birth: 06-16-1941 Referring Provider: Dr Norlene Campbell    Encounter Date: 04/08/2018  PT End of Session - 04/08/18 1137    Visit Number  7    Number of Visits  12    Date for PT Re-Evaluation  05/20/18    PT Start Time  1100    PT Stop Time  1157    PT Time Calculation (min)  57 min    Activity Tolerance  Patient tolerated treatment well       Past Medical History:  Diagnosis Date  . Hypercholesteremia     Past Surgical History:  Procedure Laterality Date  . HAND SURGERY    . SHOULDER SURGERY     rotator cuff    There were no vitals filed for this visit.  Subjective Assessment - 04/08/18 1104    Subjective  [name] reports that he is feeling much better following last treatment. Not having nearly as much pain now. Feeling good about his progress. Just one sore spot that seems to act up.     Pertinent History  HTN; Rt RCR ~ 10-12 yrs ago; cervical pain     Patient Stated Goals  Get rid of the Rt neck and shoulder pain     Currently in Pain?  Yes    Pain Score  3     Pain Location  Shoulder    Pain Orientation  Right    Pain Descriptors / Indicators  Sore;Tightness    Pain Type  Acute pain    Pain Radiating Towards  into the Rt side of the neck and upper trap area     Pain Onset  More than a month ago    Pain Frequency  Intermittent    Aggravating Factors   working    Pain Relieving Factors  sleeping          [facility] PT Assessment - 04/08/18 0001      Assessment   Medical Diagnosis  Cervicalgia; chronic Rt shoulder pain     Referring Provider  Dr Norlene Campbell     Onset Date/Surgical Date  08/10/17    Hand Dominance  Right    Next MD Visit  PRN       Posture/Postural Control   Posture Comments  less head forward;  shoulders rounded and elevated; increased thoracic kyphosis; scapulae abducted and rotated along the thoracic wall; head of he humerus anterior in orientation       AROM   Right Shoulder Extension  60 Degrees    Right Shoulder Flexion  124 Degrees    Right Shoulder ABduction  118 Degrees    Right Shoulder Internal Rotation  34 Degrees    Right Shoulder External Rotation  74 Degrees    Left Shoulder Extension  42 Degrees    Left Shoulder Flexion  120 Degrees    Left Shoulder ABduction  128 Degrees    Left Shoulder Internal Rotation  34 Degrees    Left Shoulder External Rotation  82 Degrees    Cervical Flexion  44    Cervical Extension  25    Cervical - Right Side Bend  20    Cervical - Left Side Bend  20    Cervical - Right Rotation  44    Cervical - Left  Rotation  45      Palpation   Palpation comment  muscular tightness through posterior lateral cervical/thoracic paraspinals especially at C6/6 and T3/4 as well as the pecs; upper trap; leveator; ant/lat/posterior cervical musculature Rt > Lt                    [facility] Adult PT Treatment/Exercise - 04/08/18 0001      Shoulder Exercises: Standing   Other Standing Exercises  scap squeeze with L's x 10; W's x 10 with swim noodle       Shoulder Exercises: Stretch   Other Shoulder Stretches  3 way doorway stretch 30 sec x 2 each position       Moist Heat Therapy   Number Minutes Moist Heat  20 Minutes    Moist Heat Location  Cervical;Shoulder Rt      Electrical Stimulation   Electrical Stimulation Location  Rt posterior cervical/thoracic spine and Rt shoulder girdle area     Electrical Stimulation Action  IFC    Electrical Stimulation Parameters  to tolerance    Electrical Stimulation Goals  Pain;Tone      Iontophoresis   Type of Iontophoresis  Dexamethasone    Location  Rt anterior shoulder     Dose  120 mAmp    Time  12 hr       Manual Therapy   Manual therapy comments  pt sitting     Soft tissue mobilization   deep tissue work through the Rt lateral and posterior cervical musculature; pecs; upper trap; leveator    Myofascial Release  Rt upper trap              PT Education - 04/08/18 1120    Education provided  Yes    Education Details  review of HEP     Person(s) Educated  Patient    Methods  Explanation;Demonstration;Tactile cues;Verbal cues    Comprehension  Verbalized understanding;Returned demonstration;Verbal cues required;Tactile cues required          PT Long Term Goals - 04/08/18 1141      PT LONG TERM GOAL #1   Title  Decrease pain in Rt cervical and shoulder girdle area by 75-90% allowing patient to participate in normal functional activities without limitations 05/20/18    Time  12    Period  Weeks    Status  Revised      PT LONG TERM GOAL #2   Title  Improve posture and alignment with patient to demonstrate improved upright posture with posterior shoulder girdle musculature engaged 05/20/18    Time  12    Period  Weeks    Status  Revised      PT LONG TERM GOAL #3   Title  Increase cervical ext/lateral flexion/rotation by 6-8 degrees 05/20/18    Time  12    Period  Weeks    Status  Revised      PT LONG TERM GOAL #4   Title  Independent in HEP 05/20/18    Time  12    Period  Weeks    Status  Revised      PT LONG TERM GOAL #5   Title  Improve FOTO to </= 33% limitation 04/16/18    Time  6    Period  Weeks    Status  Achieved            Plan - 04/08/18 1138    Clinical Impression Statement  Patient reports continued intermittent  pain and tightness inthe Rt posterior shoulder area. Symptoms have improved but he continues to experience increased pain and tightness with functional activities. Patient reports improvement with therapy including DN and manual work followed by modalities. He demonstrates increased shoulder and cervical mobility but is limited. Muscular tightness persKaylorts through the Rt posterior shoulder girdle area at upper trap and leveator  primarily. Jerami will benefit from continued PT to further improve functional activity level.     Rehab Potential  Good    PT Frequency  2x / week    PT Duration  12 weeks    PT Treatment/Interventions  Patient/family education;ADLs/Self Care Home Management;Cryotherapy;Electrical Stimulation;Iontophoresis /ml Dexamethasone;Moist Heat;Ultrasound;Dry needling;Manual techniques;Neuromuscular re-education;Therapeutic activities;Therapeutic exercise    PT Next Visit Plan  continue treatment including DN, manual work, stretching; strengthening; modalities     Consulted and Agree with Plan of Care  Patient       Patient will benefit from skilled therapeutic intervention in order to improve the following deficits and impairments:  Postural dysfunction, Improper body mechanics, Increased fascial restricitons, Increased muscle spasms, Decreased mobility, Decreased range of motion, Decreased activity tolerance, Pain  Visit Diagnosis: Cervicalgia - Plan: PT plan of care cert/re-cert  Other symptoms and signs involving the musculoskeletal system - Plan: PT plan of care cert/re-cert  Abnormal posture - Plan: PT plan of care cert/re-cert  Chronic right shoulder pain - Plan: PT plan of care cert/re-cert     Problem List Patient Active Problem List   Diagnosis Date Noted  . Left knee injury 11/20/2012    Kyrin Garn Rober Minion PT, MPH  04/08/2018, 11:45 AM  [facility] [address] [place], [place], [address] Phone: [phone]   Fax:  [phone]  Name: Marcio Hoque MRN: [id number] Date of Birth: Apr 17, 1941

## 2018-04-14 ENCOUNTER — Encounter: Payer: Self-pay | Admitting: Rehabilitative and Restorative Service Providers"

## 2018-04-14 ENCOUNTER — Ambulatory Visit: Payer: Medicare HMO | Admitting: Rehabilitative and Restorative Service Providers"

## 2018-04-14 DIAGNOSIS — M542 Cervicalgia: Secondary | ICD-10-CM | POA: Diagnosis not present

## 2018-04-14 DIAGNOSIS — R293 Abnormal posture: Secondary | ICD-10-CM

## 2018-04-14 DIAGNOSIS — M25511 Pain in right shoulder: Secondary | ICD-10-CM | POA: Diagnosis not present

## 2018-04-14 DIAGNOSIS — R29898 Other symptoms and signs involving the musculoskeletal system: Secondary | ICD-10-CM

## 2018-04-14 DIAGNOSIS — G8929 Other chronic pain: Secondary | ICD-10-CM | POA: Diagnosis not present

## 2018-04-14 NOTE — Therapy (Signed)
[place], [place], [address] Phone: [phone]   Fax:  [phone]  Physical Therapy Treatment  Patient Details  Name: Martin Contreras MRN: [id number] Date of Birth: 1941/09/21 Referring Provider: Dr Joni Fears    Encounter Date: 04/14/2018  PT End of Session - 04/14/18 1200    Visit Number  8    Number of Visits  12    Date for PT Re-Evaluation  05/20/18    PT Start Time  1104    PT Stop Time  1200    PT Time Calculation (min)  56 min    Activity Tolerance  Patient tolerated treatment well       Past Medical History:  Diagnosis Date  . Hypercholesteremia     Past Surgical History:  Procedure Laterality Date  . HAND SURGERY    . SHOULDER SURGERY     rotator cuff    There were no vitals filed for this visit.  Subjective Assessment - 04/14/18 1229    Subjective  Continued imporvement with less pain and improving mobility. 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 Working on his exercises at home. Pleased with progress.     Currently in Pain?  Yes    Pain Score  2     Pain Location  Shoulder    Pain Orientation  Right;Posterior    Pain Descriptors / Indicators  Sore;Tightness    Pain Type  Acute pain         [facility] PT Assessment - 04/14/18 0001      Assessment   Medical Diagnosis  Cervicalgia; chronic Rt shoulder pain     Referring Provider  Dr Joni Fears     Onset Date/Surgical Date  08/10/17    Hand Dominance  Right    Next MD Visit  PRN       AROM   Cervical Flexion  48    Cervical Extension  29    Cervical - Right Side Bend  22    Cervical - Left Side Bend  19    Cervical - Right Rotation  44    Cervical - Left Rotation  45                   [facility] Adult PT Treatment/Exercise - 04/14/18 0001      Shoulder Exercises: Standing   Other Standing Exercises  scap squeeze with L's x 10; W's x 10 with swim noodle        Shoulder Exercises: Stretch   Other Shoulder Stretches  3 way doorway stretch 30 sec x 2 each position       Moist Heat Therapy   Number Minutes Moist Heat  20 Minutes    Moist Heat Location  Cervical;Shoulder Rt      Electrical Stimulation   Electrical Stimulation Location  Rt posterior cervical/thoracic spine and Rt shoulder girdle area     Electrical Stimulation Action  IFC    Electrical Stimulation Parameters  to tolerance    Electrical Stimulation Goals  Pain;Tone      Iontophoresis   Type of Iontophoresis  Dexamethasone    Location  Rt anterior shoulder     Dose  80 mAmp    Time  8 hr       Manual Therapy   Manual therapy comments  pt sitting     Soft tissue mobilization  deep tissue work through the [place]  lateral and posterior cervical musculature; pecs; upper trap; leveator    Myofascial Release  Rt upper trap        Trigger Point Dry Needling - 04/14/18 1239    Consent Given?  Yes    Muscles Treated Upper Body  -- thoracic paraspinals; leveator (does not like estim)     Upper Trapezius Response  Palpable increased muscle length                PT Long Term Goals - 04/14/18 1240      PT LONG TERM GOAL #1   Title  Decrease pain in Rt cervical and shoulder girdle area by 75-90% allowing patient to participate in normal functional activities without limitations 05/20/18    Time  12    Period  Weeks    Status  Partially Met      PT LONG TERM GOAL #2   Title  Improve posture and alignment with patient to demonstrate improved upright posture with posterior shoulder girdle musculature engaged 05/20/18    Time  12    Period  Weeks    Status  On-going      PT LONG TERM GOAL #3   Title  Increase cervical ext/lateral flexion/rotation by 6-8 degrees 05/20/18    Time  12    Period  Weeks    Status  On-going      PT LONG TERM GOAL #4   Title  Independent in HEP 05/20/18    Time  12    Period  Weeks    Status  On-going      PT LONG TERM GOAL #5   Title   Improve FOTO to </= 33% limitation 04/16/18    Time  6    Period  Weeks    Status  Achieved            Plan - 04/14/18 1227    Clinical Impression Statement  Gradual improvement continues. Patient reports decreased frequency, intensity, duration of pain. 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 Patient demonstrates some increase in cervical mobility and ROM. There is less palpable tightness in the Rt leveator and upper trap musculature. He continues to make steady progress toward stated goals of therapy.     Rehab Potential  Good    PT Frequency  2x / week    PT Duration  12 weeks    PT Treatment/Interventions  Patient/family education;ADLs/Self Care Home Management;Cryotherapy;Electrical Stimulation;Iontophoresis 30m/ml Dexamethasone;Moist Heat;Ultrasound;Dry needling;Manual techniques;Neuromuscular re-education;Therapeutic activities;Therapeutic exercise    PT Next Visit Plan  continue treatment including DN, manual work, stretching; strengthening; modalities - add diagonal strengthening with TB     Consulted and Agree with Plan of Care  Patient       Patient will benefit from skilled therapeutic intervention in order to improve the following deficits and impairments:  Postural dysfunction, Improper body mechanics, Increased fascial restricitons, Increased muscle spasms, Decreased mobility, Decreased range of motion, Decreased activity tolerance, Pain  Visit Diagnosis: Cervicalgia  Other symptoms and signs involving the musculoskeletal system  Abnormal posture  Chronic right shoulder pain     Problem List Patient Active Problem List   Diagnosis Date Noted  . Left knee injury 11/20/2012    Martin Contreras PNilda SimmerPT, MPH  04/14/2018, 12:41 PM  [facility] [address]Phone: [phone]  Fax:  [phone] Name: Martin SpratlingMRN:  [id number]  of Birth: December 16, 1940

## 2018-04-21 ENCOUNTER — Encounter: Payer: Self-pay | Admitting: Rehabilitative and Restorative Service Providers"

## 2018-04-21 ENCOUNTER — Ambulatory Visit: Payer: Medicare HMO | Admitting: Rehabilitative and Restorative Service Providers"

## 2018-04-21 DIAGNOSIS — R293 Abnormal posture: Secondary | ICD-10-CM

## 2018-04-21 DIAGNOSIS — R29898 Other symptoms and signs involving the musculoskeletal system: Secondary | ICD-10-CM

## 2018-04-21 DIAGNOSIS — M25511 Pain in right shoulder: Secondary | ICD-10-CM | POA: Diagnosis not present

## 2018-04-21 DIAGNOSIS — G8929 Other chronic pain: Secondary | ICD-10-CM

## 2018-04-21 DIAGNOSIS — M542 Cervicalgia: Secondary | ICD-10-CM | POA: Diagnosis not present

## 2018-04-21 NOTE — Therapy (Signed)
[place], [place], [address] Phone: [phone]   Fax:  [phone]  Physical Therapy Treatment  Patient Details  Name: Martin Contreras MRN: [id number] Date of Birth: 03-17-1941 Referring Provider: Dr Joni Fears   Encounter Date: 04/21/2018  PT End of Session - 04/21/18 1107    Visit Number  9    Number of Visits  12    Date for PT Re-Evaluation  05/20/18    PT Start Time  1104    PT Stop Time  1200    PT Time Calculation (min)  56 min    Activity Tolerance  Patient tolerated treatment well       Past Medical History:  Diagnosis Date  . Hypercholesteremia     Past Surgical History:  Procedure Laterality Date  . HAND SURGERY    . SHOULDER SURGERY     rotator cuff    There were no vitals filed for this visit.  Subjective Assessment - 04/21/18 1108    Subjective  Improving. 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 Can sleep on his Rt side now. Not have any "real pain".     Currently in Pain?  Yes    Pain Score  2     Pain Location  Shoulder    Pain Orientation  Right;Posterior    Pain Descriptors / Indicators  Sore;Tightness    Pain Type  Acute pain         [facility] PT Assessment - 04/21/18 0001      Assessment   Medical Diagnosis  Cervicalgia; chronic Rt shoulder pain     Referring Provider  Dr Joni Fears    Onset Date/Surgical Date  08/10/17    Hand Dominance  Right    Next MD Visit  PRN       Palpation   Palpation comment  decreased muscular tightness through posterior lateral cervical/thoracic paraspinals especially at C6/6 and T3/4 as well as the pecs; upper trap; leveator; ant/lat/posterior cervical musculature Rt > Lt                    [facility] Adult PT Treatment/Exercise - 04/21/18 0001      Shoulder Exercises: Standing   Extension  Strengthening;Both;10 reps;Theraband    Theraband Level  (Shoulder Extension)  Level 3 (Green)    Row  Strengthening;Right;Left;20 reps;Theraband    Theraband Level (Shoulder Row)  Level 3 (Green)    Retraction  Strengthening;Right;Left;20 reps;Theraband    Theraband Level (Shoulder Retraction)  Level 3 (Green)    Other Standing Exercises  scap squeeze with L's x 10; W's x 10 with swim noodle       Shoulder Exercises: Stretch   Other Shoulder Stretches  3 way doorway stretch 30 sec x 2 each position       Moist Heat Therapy   Number Minutes Moist Heat  20 Minutes    Moist Heat Location  Cervical;Shoulder Rt      Electrical Stimulation   Electrical Stimulation Location  Rt posterior cervical/thoracic spine and Rt shoulder girdle area     Electrical Stimulation Action  TENS    Electrical Stimulation Parameters  to tolerance    Electrical Stimulation Goals  Pain;Tone      Iontophoresis   Type of Iontophoresis  Dexamethasone    Location  Rt anterior shoulder     Dose  80 mAmp  Time  8 hr       Manual Therapy   Manual therapy comments  pt sitting     Soft tissue mobilization  deep tissue work through the Rt lateral and posterior cervical musculature; pecs; upper trap; leveator    Myofascial Release  Rt upper trap       Neck Exercises: Stretches   Other Neck Stretches  lateral cervical flexion 30 sec x 3 each side sitting w/ PT guidance    Other Neck Stretches  cervical rotation 15 sec x 2 each direction - sitting PT guidalnce        Trigger Point Dry Needling - 04/21/18 1138    Consent Given?  Yes    Muscles Treated Upper Body  -- Rt leveator scap -with estim     Upper Trapezius Response  Palpable increased muscle length                PT Long Term Goals - 04/21/18 1136      PT LONG TERM GOAL #1   Title  Decrease pain in Rt cervical and shoulder girdle area by 75-90% allowing patient to participate in normal functional activities without limitations 05/20/18    Time  12    Period  Weeks    Status  Partially Met       PT LONG TERM GOAL #2   Title  Improve posture and alignment with patient to demonstrate improved upright posture with posterior shoulder girdle musculature engaged 05/20/18    Time  12    Period  Weeks    Status  On-going      PT LONG TERM GOAL #3   Title  Increase cervical ext/lateral flexion/rotation by 6-8 degrees 05/20/18    Time  12    Period  Weeks    Status  On-going      PT LONG TERM GOAL #4   Title  Independent in HEP 05/20/18    Time  12    Period  Weeks    Status  On-going      PT LONG TERM GOAL #5   Title  Improve FOTO to </= 33% limitation 04/16/18    Time  6    Period  Weeks    Status  Achieved            Plan - 04/21/18 1134    Clinical Impression Statement  Continued improvement with 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 Patient has decreased muscular tightness to palpation through the leveator; continued limited cervical ROM/mobility. Progressing well toward stated goals of therapy.     Rehab Potential  Good    PT Frequency  2x / week    PT Treatment/Interventions  Patient/family education;ADLs/Self Care Home Management;Cryotherapy;Electrical Stimulation;Iontophoresis 60m/ml Dexamethasone;Moist Heat;Ultrasound;Dry needling;Manual techniques;Neuromuscular re-education;Therapeutic activities;Therapeutic exercise    PT Next Visit Plan  continue treatment including DN, manual work, stretching; strengthening; modalities - add diagonal strengthening with TB - reschedule for 2 weeks assess continued progress at that time.     Consulted and Agree with Plan of Care  Patient       Patient will benefit from skilled therapeutic intervention in order to improve the following deficits and impairments:  Postural dysfunction, Improper body mechanics, Increased fascial restricitons, Increased muscle spasms, Decreased mobility, Decreased range of motion, Decreased activity tolerance, Pain  Visit  Diagnosis: Cervicalgia  Other symptoms and signs involving the musculoskeletal system  Abnormal posture  Chronic right shoulder pain  Problem List Patient Active Problem List   Diagnosis Date Noted  . Left knee injury 11/20/2012    Traeton Bordas Nilda Simmer PT, MPH  04/21/2018, 11:40 AM  [facility] [place], [place], [address] Phone: [phone]   Fax:  [phone]  Name: Janson Lamar MRN: [id number] Date of Birth: 09/12/1941

## 2018-05-06 ENCOUNTER — Ambulatory Visit: Payer: Medicare HMO | Admitting: Rehabilitative and Restorative Service Providers"

## 2018-05-06 ENCOUNTER — Encounter: Payer: Self-pay | Admitting: Rehabilitative and Restorative Service Providers"

## 2018-05-06 DIAGNOSIS — M25511 Pain in right shoulder: Secondary | ICD-10-CM | POA: Diagnosis not present

## 2018-05-06 DIAGNOSIS — R293 Abnormal posture: Secondary | ICD-10-CM

## 2018-05-06 DIAGNOSIS — M542 Cervicalgia: Secondary | ICD-10-CM | POA: Diagnosis not present

## 2018-05-06 DIAGNOSIS — R29898 Other symptoms and signs involving the musculoskeletal system: Secondary | ICD-10-CM

## 2018-05-06 DIAGNOSIS — G8929 Other chronic pain: Secondary | ICD-10-CM

## 2018-05-06 NOTE — Therapy (Addendum)
[place], [place], [address] Phone: [phone]   Fax:  [phone]  Physical Therapy Treatment  Patient Details  Name: Martin Contreras MRN: [id number] Date of Birth: 03/26/1941 Referring Provider: Dr Joni Fears    Encounter Date: 05/06/2018    Past Medical History:  Diagnosis Date  . Hypercholesteremia     Past Surgical History:  Procedure Laterality Date  . HAND SURGERY    . SHOULDER SURGERY     rotator cuff    There were no vitals filed for this visit.                                 PT Long Term Goals - 05/06/18 1114      PT LONG TERM GOAL #1   Title  Decrease pain in Rt cervical and shoulder girdle area by 75-90% allowing patient to participate in normal functional activities without limitations 05/20/18    Time  12    Period  Weeks    Status  Partially Met      PT LONG TERM GOAL #2   Title  Improve posture and alignment with patient to demonstrate improved upright posture with posterior shoulder girdle musculature engaged 05/20/18    Time  12    Period  Weeks    Status  Partially Met      PT LONG TERM GOAL #3   Title  Increase cervical ext/lateral flexion/rotation by 6-8 degrees 05/20/18    Time  12    Period  Weeks    Status  Partially Met      PT LONG TERM GOAL #4   Title  Independent in HEP 05/20/18    Time  12    Period  Weeks    Status  Achieved      PT LONG TERM GOAL #5   Title  Improve FOTO to </= 33% limitation 04/16/18    Time  6    Period  Weeks    Status  Achieved              Patient will benefit from skilled therapeutic intervention in order to improve the following deficits and impairments:  Postural dysfunction, Improper body mechanics, Increased fascial restricitons, Increased muscle spasms, Decreased mobility, Decreased range of motion, Decreased activity tolerance, Pain  Visit Diagnosis: Cervicalgia  Other  symptoms and signs involving the musculoskeletal system  Abnormal posture  Chronic right shoulder pain     Problem List Patient Active Problem List   Diagnosis Date Noted  . Left knee injury 11/20/2012    Ralyn Stlaurent Nilda Simmer PT, MPH  06/03/2018, 12:55 PM  [facility] [place], [place], [address] Phone: [phone]   Fax:  [phone]  Name: Martin Contreras MRN: [id number] Date of Birth: 08-24-1941

## 2018-05-13 ENCOUNTER — Encounter: Payer: Self-pay | Admitting: Rehabilitative and Restorative Service Providers"
